# Patient Record
Sex: Female | Born: 1950 | Race: White | Hispanic: No | Marital: Married | Smoking: Never smoker
Health system: Southern US, Community
[De-identification: ages and names within clinical notes are randomized; demographics above are authoritative.]

## PROBLEM LIST (undated history)

## (undated) DIAGNOSIS — D329 Benign neoplasm of meninges, unspecified: Secondary | ICD-10-CM

## (undated) DIAGNOSIS — J385 Laryngeal spasm: Secondary | ICD-10-CM

## (undated) DIAGNOSIS — K219 Gastro-esophageal reflux disease without esophagitis: Secondary | ICD-10-CM

## (undated) DIAGNOSIS — E7800 Pure hypercholesterolemia, unspecified: Secondary | ICD-10-CM

## (undated) DIAGNOSIS — J3800 Paralysis of vocal cords and larynx, unspecified: Secondary | ICD-10-CM

## (undated) HISTORY — PX: PLACEMENT OF BREAST IMPLANTS: SHX6334

## (undated) HISTORY — PX: OTHER SURGICAL HISTORY: SHX169

## (undated) HISTORY — PX: CATARACT EXTRACTION: SUR2

## (undated) HISTORY — PX: TONSILECTOMY, ADENOIDECTOMY, BILATERAL MYRINGOTOMY AND TUBES: SHX2538

---

## 2015-11-09 DIAGNOSIS — N3946 Mixed incontinence: Secondary | ICD-10-CM | POA: Insufficient documentation

## 2015-11-09 DIAGNOSIS — R319 Hematuria, unspecified: Secondary | ICD-10-CM | POA: Insufficient documentation

## 2016-07-24 DIAGNOSIS — J3800 Paralysis of vocal cords and larynx, unspecified: Secondary | ICD-10-CM

## 2016-07-24 HISTORY — PX: BRAIN SURGERY: SHX531

## 2016-07-24 HISTORY — PX: OTHER SURGICAL HISTORY: SHX169

## 2016-07-24 HISTORY — DX: Paralysis of vocal cords and larynx, unspecified: J38.00

## 2017-06-25 DIAGNOSIS — K219 Gastro-esophageal reflux disease without esophagitis: Secondary | ICD-10-CM | POA: Insufficient documentation

## 2017-07-04 DIAGNOSIS — R1319 Other dysphagia: Secondary | ICD-10-CM | POA: Insufficient documentation

## 2017-07-04 DIAGNOSIS — I10 Essential (primary) hypertension: Secondary | ICD-10-CM | POA: Insufficient documentation

## 2017-07-04 DIAGNOSIS — G936 Cerebral edema: Secondary | ICD-10-CM | POA: Insufficient documentation

## 2017-07-05 DIAGNOSIS — R0681 Apnea, not elsewhere classified: Secondary | ICD-10-CM | POA: Insufficient documentation

## 2017-07-19 DIAGNOSIS — D329 Benign neoplasm of meninges, unspecified: Secondary | ICD-10-CM | POA: Insufficient documentation

## 2017-07-19 DIAGNOSIS — Z86018 Personal history of other benign neoplasm: Secondary | ICD-10-CM | POA: Insufficient documentation

## 2017-07-19 DIAGNOSIS — R9 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: Secondary | ICD-10-CM | POA: Insufficient documentation

## 2019-06-16 ENCOUNTER — Other Ambulatory Visit: Payer: Self-pay

## 2019-06-16 DIAGNOSIS — Z20822 Contact with and (suspected) exposure to covid-19: Secondary | ICD-10-CM

## 2019-06-17 LAB — NOVEL CORONAVIRUS, NAA: SARS-CoV-2, NAA: NOT DETECTED

## 2019-08-24 ENCOUNTER — Ambulatory Visit: Payer: Self-pay

## 2019-08-29 ENCOUNTER — Ambulatory Visit: Payer: Self-pay

## 2019-08-31 ENCOUNTER — Ambulatory Visit: Payer: Self-pay

## 2019-12-12 DIAGNOSIS — E782 Mixed hyperlipidemia: Secondary | ICD-10-CM | POA: Insufficient documentation

## 2020-02-03 ENCOUNTER — Emergency Department: Payer: Medicare PPO

## 2020-02-03 ENCOUNTER — Other Ambulatory Visit: Payer: Self-pay

## 2020-02-03 ENCOUNTER — Emergency Department
Admission: EM | Admit: 2020-02-03 | Discharge: 2020-02-03 | Disposition: A | Discharge status: Home / Self Care | Payer: Medicare PPO | Attending: Emergency Medicine | Admitting: Emergency Medicine

## 2020-02-03 DIAGNOSIS — J385 Laryngeal spasm: Secondary | ICD-10-CM

## 2020-02-03 DIAGNOSIS — R0989 Other specified symptoms and signs involving the circulatory and respiratory systems: Secondary | ICD-10-CM | POA: Insufficient documentation

## 2020-02-03 DIAGNOSIS — R911 Solitary pulmonary nodule: Secondary | ICD-10-CM

## 2020-02-03 HISTORY — DX: Benign neoplasm of meninges, unspecified: D32.9

## 2020-02-03 HISTORY — DX: Paralysis of vocal cords and larynx, unspecified: J38.00

## 2020-02-03 HISTORY — DX: Pure hypercholesterolemia, unspecified: E78.00

## 2020-02-03 LAB — CBC
HCT: 37.8 % (ref 36.0–46.0)
Hemoglobin: 12.6 g/dL (ref 12.0–15.0)
MCH: 30 pg (ref 26.0–34.0)
MCHC: 33.3 g/dL (ref 30.0–36.0)
MCV: 90 fL (ref 80.0–100.0)
Platelets: 191 10*3/uL (ref 150–400)
RBC: 4.2 MIL/uL (ref 3.87–5.11)
RDW: 13.6 % (ref 11.5–15.5)
WBC: 6.6 10*3/uL (ref 4.0–10.5)
nRBC: 0 % (ref 0.0–0.2)

## 2020-02-03 LAB — BASIC METABOLIC PANEL
Anion gap: 10 (ref 5–15)
BUN: 14 mg/dL (ref 8–23)
CO2: 28 mmol/L (ref 22–32)
Calcium: 8.9 mg/dL (ref 8.9–10.3)
Chloride: 97 mmol/L — ABNORMAL LOW (ref 98–111)
Creatinine, Ser: 0.59 mg/dL (ref 0.44–1.00)
GFR calc Af Amer: >60 mL/min (ref >60)
GFR calc non Af Amer: >60 mL/min (ref >60)
Glucose, Bld: 94 mg/dL (ref 70–99)
Potassium: 4.1 mmol/L (ref 3.5–5.1)
Sodium: 135 mmol/L (ref 135–145)

## 2020-02-03 LAB — TROPONIN I (HIGH SENSITIVITY): Troponin I (High Sensitivity): 3 ng/L (ref <18)

## 2020-02-03 MED ORDER — IOHEXOL 350 MG/ML SOLN
75.0000 mL | Freq: Once | INTRAVENOUS | Status: AC | PRN
  Administered 2020-02-03: 75 mL via INTRAVENOUS

## 2020-02-03 NOTE — ED Triage Notes (Signed)
Reports awakening from cough with SOB towards right side of her lungs. States that it was severe upon awakening but is mild SOB now, "I don't feel like I can get a good breath". Pt talks in complete sentences without any difficulty. No RR distress noted.

## 2020-02-03 NOTE — ED Notes (Signed)
Ppt to CT

## 2020-02-03 NOTE — ED Provider Notes (Signed)
ER Provider Note       Time seen: 8:48 AM    I have reviewed the vital signs and the nursing notes.  HISTORY   Chief Complaint Shortness of Breath   HPI Phyllis Andrews is a 69 y.o. female with a history of hyperlipidemia, meningioma, vocal cord paralysis who presents today for awakening with feeling like something was choking her.  She felt like she could not breathe and like her airway was closed when she abruptly woke up.  She states she does not have any current symptoms but felt like she could not take a deep breath earlier.  She has no pain now.  Past Medical History:  Diagnosis Date  . Hypercholesteremia   . Meningioma ([place])   . Paralyzed vocal cords     History reviewed. No pertinent surgical history.  Allergies Methylprednisolone acetate and Other  Review of Systems Constitutional: Negative for fever. Cardiovascular: Negative for chest pain. HEENT: Positive for choking sensation Respiratory: Positive for difficulty breathing Gastrointestinal: Negative for abdominal pain, vomiting and diarrhea. Musculoskeletal: Negative for back pain. Skin: Negative for rash. Neurological: Negative for headaches, focal weakness or numbness.  All systems negative/normal/unremarkable except as stated in the HPI  ____________________________________________   PHYSICAL EXAM:  VITAL SIGNS: Vitals:   02/03/20 0139 02/03/20 0724  BP: (!) 120/106 (!) 134/59  Pulse: 79 (!) 58  Resp: 18 14  Temp: 98 F (36.7 C)   SpO2: 98% 100%    Constitutional: Alert and oriented. Well appearing and in no distress. Eyes: Conjunctivae are normal. Normal extraocular movements. ENT      Head: Normocephalic and atraumatic.      Nose: No congestion/rhinnorhea.      Mouth/Throat: Mucous membranes are moist.      Neck: Hoarse voice which she states is chronic for her Cardiovascular: Normal rate, regular rhythm. No murmurs, rubs, or gallops. Respiratory: Normal respiratory effort without  tachypnea nor retractions. Breath sounds are clear and equal bilaterally. No wheezes/rales/rhonchi. Gastrointestinal: Soft and nontender. Normal bowel sounds Musculoskeletal: Nontender with normal range of motion in extremities. No lower extremity tenderness nor edema. Neurologic:  Normal speech and language. No gross focal neurologic deficits are appreciated.  Skin:  Skin is warm, dry and intact. No rash noted. Psychiatric: Speech and behavior are normal.  ____________________________________________  EKG: Interpreted by me.  Sinus rhythm with rate of 73 bpm, normal PR interval, normal QRS, normal QT  ____________________________________________   LABS (pertinent positives/negatives)  Labs Reviewed  BASIC METABOLIC PANEL - Abnormal; Notable for the following components:      Result Value   Chloride 97 (*)    All other components within normal limits  CBC  TROPONIN I (HIGH SENSITIVITY)    RADIOLOGY  Images were viewed by me IMPRESSION: 1. No acute cardiopulmonary process. 2. Right perihilar nodule. Further evaluation with a chest CT Recommended. CT neck with contrast, CT angiogram of the chest IMPRESSION: Patent airway. No neck mass or adenopathy. 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 IMPRESSION: 1. 2.4 cm right lower lobe nodule, recommend multi-disciplinary thoracic oncology referral. 2. No adenopathy. 3. Negative for pulmonary embolism or other acute finding.  DIFFERENTIAL DIAGNOSIS  Laryngeal spasm, choking episode, PE, cancer, vocal cord paralysis, swallowing dysfunction  ASSESSMENT AND PLAN  Choking sensation, pulmonary nodule   Plan: The patient had presented for choking sensation. Patient's labs are unremarkable.  Chest x-ray did reveal a right perihilar nodule for which we obtained CT imaging.  CT imaging did reveal a 2.4 cm right lower lobe nodule, CT of the neck was unremarkable.  Patient was shown this  nodule and encouraged to have outpatient biopsy or at least thoracic/oncology follow-up.  Patient is agreeable to plan, likely has laryngeal spasm today but is cleared for outpatient follow-up.  Lenise Arena MD    Note: This note was generated in part or whole with voice recognition software. Voice recognition is usually quite accurate but there are transcription errors that can and very often do occur. I apologize for any typographical errors that were not detected and corrected.     Earleen Newport, MD 02/03/20 1014

## 2020-02-06 ENCOUNTER — Ambulatory Visit: Payer: Medicare PPO | Admitting: Cardiothoracic Surgery

## 2020-02-06 ENCOUNTER — Encounter: Payer: Self-pay | Admitting: Cardiothoracic Surgery

## 2020-02-06 ENCOUNTER — Other Ambulatory Visit: Payer: Self-pay

## 2020-02-06 ENCOUNTER — Telehealth: Payer: Self-pay

## 2020-02-06 VITALS — BP 129/74 | HR 92 | Temp 98.3°F | Resp 12 | Ht 67.0 in | Wt 170.0 lb

## 2020-02-06 DIAGNOSIS — R918 Other nonspecific abnormal finding of lung field: Secondary | ICD-10-CM

## 2020-02-06 NOTE — Telephone Encounter (Signed)
Completed release of record [facility]ed to [facility] to obtain all images from  2018. Confirmation received.

## 2020-02-06 NOTE — Patient Instructions (Addendum)
I will call [facility] to get a copy of your past chest x-rays.  Follow up here in 2 weeks.

## 2020-02-06 NOTE — Progress Notes (Signed)
Patient ID: Phyllis Andrews, female   DOB: Dec 25, 1950, 69 y.o.   MRN: [id number]  Chief Complaint  Patient presents with  . New Patient (Initial Visit)    pulmonary nodule    Referred By Dr. Tressia Miners Reason for Referral right lower lobe mass  HPI Location, Quality, Duration, Severity, Timing, Context, Modifying Factors, Associated Signs and Symptoms.  Phyllis Andrews is a 69 y.o. female.  This patient has a complicated history which dates back to about 2 years ago.  She underwent resection of a lesion on left through a right occipital incision.  At the time she tells me she had brainstem compression and ultimately ended up with a right vocal cord paralysis requiring medialization.  She has had 2 episodes of acute shortness of breath which she has attributes to issues related to her vocal cords since that time.  She presented to our emergency department earlier this week with complaints of acute shortness of breath.  She states this only lasted about a minute but since this has been a second episode she thought she would get it checked.  When she arrived here she was back to her baseline.  She had no complaints.  However she did wait to be seen by our ER physicians and a chest x-ray CT scan of the neck and chest were performed.  The chest x-ray revealed a 2.5 cm lobulated mass in the right hilum.  The CT scan appeared to show a benign (hamartoma) in the right lower lobe.  There were no other acute findings to explain her shortness of breath.  She presents now for further evaluation of this.  Of note is that she brought with her a chest x-ray report from [place] in 2018 when she underwent a craniotomy.  That reveals a lobulated right hilar mass.  No additional follow-up was performed at that time.  She does not recall receiving a CT scan back then of her chest.  She is a lifelong non-smoker.  She does not get short of breath.  She has had no hemoptysis fevers or chills.   Past Medical History:  Diagnosis Date   . Hypercholesteremia   . Meningioma ([place])   . Paralyzed vocal cords     Past Surgical History:  Procedure Laterality Date  . menigioma  2018  . PLACEMENT OF BREAST IMPLANTS    . TONSILECTOMY, ADENOIDECTOMY, BILATERAL MYRINGOTOMY AND TUBES      Family History  Problem Relation Age of Onset  . Dementia Mother   . Dementia Father   . Prostate cancer Brother     Social History Social History   Tobacco Use  . Smoking status: Never Smoker  . Smokeless tobacco: Never Used  Substance Use Topics  . Alcohol use: Not Currently  . Drug use: Never    Allergies  Allergen Reactions  . Methylprednisolone Acetate Other (See Comments)    Severe facial swelling  . Other     A shot for poison ivy made her face swell.    Current Outpatient Medications  Medication Sig Dispense Refill  . acetaminophen (TYLENOL) 325 MG tablet Take by mouth.    . calcium-vitamin D (OSCAL WITH D) 500-200 MG-UNIT TABS tablet Take by mouth.    . Coenzyme Q10 10 MG capsule Take by mouth.    . famotidine (PEPCID) 10 MG tablet Take by mouth.    . fluticasone (FLONASE) 50 MCG/ACT nasal spray Place into the nose.    . melatonin 1 MG TABS tablet Take by mouth.    Marland Kitchen  niMODipine (NYMALIZE) 60 MG/20ML SOLN Give 10 mL (30 mg total) into tube every 4 (four) hours.    . Omega-3 Fatty Acids (RA FISH OIL) 1000 MG CAPS Take by mouth.    . pravastatin (PRAVACHOL) 20 MG tablet Take 20 mg by mouth daily.    . psyllium (METAMUCIL) 58.6 % packet Take by mouth.     No current facility-administered medications for this visit.      Review of Systems A complete review of systems was asked and was negative except for the following positive findings heartburn, constipation, fever and chills.  Blood pressure 129/74, pulse 92, temperature 98.3 F (36.8 C), temperature source Oral, resp. rate 12, height 5\' 7"  (1.702 m), weight 170 lb (77.1 kg), SpO2 97 %.  Physical Exam CONSTITUTIONAL:  Pleasant, well-developed,  well-nourished, and in no acute distress. EYES: Pupils equal and reactive to light, Sclera non-icteric EARS, NOSE, MOUTH AND THROAT:  The oropharynx was clear.  Dentition is good repair.  Oral mucosa pink and moist. LYMPH NODES:  Lymph nodes in the neck and axillae were normal RESPIRATORY:  Lungs were clear.  Normal respiratory effort without pathologic use of accessory muscles of respiration CARDIOVASCULAR: Heart was regular without murmurs.  There were no carotid bruits. GI: The abdomen was soft, nontender, and nondistended. There were no palpable masses. There was no hepatosplenomegaly. There were normal bowel sounds in all quadrants. GU:  Rectal deferred.   MUSCULOSKELETAL:  Normal muscle strength and tone.  No clubbing or cyanosis.   SKIN:  There were no pathologic skin lesions.  There were no nodules on palpation. NEUROLOGIC:  Sensation is normal.  Cranial nerves are grossly intact. PSYCH:  Oriented to person, place and time.  Mood and affect are normal.  Data Reviewed CT scan and chest x-ray  I have personally reviewed the patient's imaging, laboratory findings and medical records.    Assessment    I have reviewed the patient's CT scan and chest x-ray.  There is a lobulated right lower lobe mass.  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  She did bring with her records from [place] saying that there is a right hilar mass.  We will obtain those x-rays from 2018 and compare them with our current x-rays.  We also reviewed the options of PET scanning, bronchoscopic biopsy or surgical resection.  At present time she would like [place] to compare the x-rays first to see if there is been any change.  We will see her back again in 2 weeks.       Nestor Lewandowsky, MD 02/06/2020, 9:32 AM

## 2020-02-10 NOTE — Telephone Encounter (Signed)
Spoke with [facility] radiology- images shared-spoke with Corene Cornea to upload at [facility] -spoke with Diane at [facility] radiology to read and compare 2018 images to July 2021 images.

## 2020-02-20 ENCOUNTER — Ambulatory Visit: Payer: Medicare PPO | Admitting: Cardiothoracic Surgery

## 2020-02-20 ENCOUNTER — Encounter: Payer: Self-pay | Admitting: Cardiothoracic Surgery

## 2020-02-20 ENCOUNTER — Other Ambulatory Visit: Payer: Self-pay

## 2020-02-20 VITALS — BP 128/78 | HR 68 | Temp 98.1°F | Ht 67.0 in | Wt 168.8 lb

## 2020-02-20 DIAGNOSIS — R918 Other nonspecific abnormal finding of lung field: Secondary | ICD-10-CM

## 2020-02-20 NOTE — Patient Instructions (Addendum)
We will get you set up for Pulmonary Function studies.  You are scheduled for this on Tuesday August 17th at 9:00 am. You will need to arrive there by 9:00 am and go in through the [place] entrance at [facility]. NO Caffeine at all prior to this test.   You will need a Covid test the day prior on Monday August 16th at the [place] testing site between 8 am and 12 pm.   You are scheduled for a PET scan at [facility] on Wednesday August 4th at 12:30 pm.  You will need to arrive there by 12:00 pm and go in through the [facility], you will need to have nothing to eat for 6 hours prior. You may have water.   We will have you follow up here in a few weeks after these tests are completed. Please see your appointment.

## 2020-02-20 NOTE — Progress Notes (Signed)
Phyllis Andrews Follow Up Note  Patient ID: Phyllis Andrews, female   DOB: 1951-01-08, 69 y.o.   MRN: [id number]  HISTORY: She returns today in follow-up.  We were able to get the x-rays from [facility] and we were able to compare them to her most recent ones.  The x-rays from [facility] are portable chest x-rays but the official radiology report shows that the lesion in the right lower lobe is somewhat more conspicuous and perhaps larger than it was 3 years ago.  I did measure the 2 images and there has been a slight growth from approximately 2.3 to 2.8 cm over the last 3 years.    Vitals:   02/20/20 0850  BP: 128/78  Pulse: 68  Temp: 98.1 F (36.7 C)  SpO2: 96%     EXAM:  Resp: Lungs are clear bilaterally.  No respiratory distress, normal effort. Heart:  Regular without murmurs Abd:  Abdomen is soft, non distended and non tender. No masses are palpable.  There is no rebound and no guarding.  Neurological: Alert and oriented to person, place, and time. Coordination normal.  Skin: Skin is warm and dry. No rash noted. No diaphoretic. No erythema. No pallor.  Psychiatric: Normal mood and affect. Normal behavior. Judgment and thought content normal.      ASSESSMENT: Right lower lobe lobulated mass   PLAN:   I had a long discussion with the patient regarding the indications and risks of right thoracotomy and right lower lobectomy.  I explained her that I thought that this lesion was most likely a hamartoma although a carcinoid may have a similar appearance.  I also reviewed with her the possibility of a bronchoscopy and biopsy.  We discussed the alternatives to surgery which would include biopsy and/or continued observation.  We discussed the indications and risks of thoracotomy and including the risks of bleeding, infection, air leak and death.  We also discussed the long-term consequences of lower lobe resection.  Her and her husband have thought about their options and they would like to obtain a PET scan and  a complete set of pulmonary function studies prior to making any final decisions.  I am somewhat reassured of the slow growth of the lesion over the last 3 years.  We will obtain a PET scan and pulmonary function studies and I will see her back in 2 weeks    Nestor Lewandowsky, MD

## 2020-02-25 ENCOUNTER — Ambulatory Visit
Admission: RE | Admit: 2020-02-25 | Discharge: 2020-02-25 | Disposition: A | Discharge status: Home / Self Care | Payer: Medicare PPO | Source: Ambulatory Visit | Attending: Cardiothoracic Surgery | Admitting: Cardiothoracic Surgery

## 2020-02-25 ENCOUNTER — Other Ambulatory Visit: Payer: Self-pay

## 2020-02-25 DIAGNOSIS — R918 Other nonspecific abnormal finding of lung field: Secondary | ICD-10-CM | POA: Insufficient documentation

## 2020-02-25 LAB — GLUCOSE, CAPILLARY: Glucose-Capillary: 76 mg/dL (ref 70–99)

## 2020-02-25 MED ORDER — FLUDEOXYGLUCOSE F - 18 (FDG) INJECTION
8.7000 | Freq: Once | INTRAVENOUS | Status: AC | PRN
  Administered 2020-02-25: 8.97 via INTRAVENOUS

## 2020-03-08 ENCOUNTER — Other Ambulatory Visit
Admission: RE | Admit: 2020-03-08 | Discharge: 2020-03-08 | Disposition: A | Discharge status: Home / Self Care | Payer: Medicare PPO | Source: Ambulatory Visit | Attending: Cardiothoracic Surgery | Admitting: Cardiothoracic Surgery

## 2020-03-08 ENCOUNTER — Other Ambulatory Visit: Payer: Self-pay

## 2020-03-08 DIAGNOSIS — Z20822 Contact with and (suspected) exposure to covid-19: Secondary | ICD-10-CM | POA: Insufficient documentation

## 2020-03-08 DIAGNOSIS — Z01812 Encounter for preprocedural laboratory examination: Secondary | ICD-10-CM | POA: Diagnosis present

## 2020-03-08 LAB — SARS CORONAVIRUS 2 (TAT 6-24 HRS): SARS Coronavirus 2: NEGATIVE

## 2020-03-09 ENCOUNTER — Ambulatory Visit: Payer: Medicare PPO | Attending: Cardiothoracic Surgery

## 2020-03-09 DIAGNOSIS — R918 Other nonspecific abnormal finding of lung field: Secondary | ICD-10-CM | POA: Diagnosis not present

## 2020-03-09 MED ORDER — ALBUTEROL SULFATE (2.5 MG/3ML) 0.083% IN NEBU
2.5000 mg | INHALATION_SOLUTION | Freq: Once | RESPIRATORY_TRACT | Status: AC
  Administered 2020-03-09: 2.5 mg via RESPIRATORY_TRACT
  Filled 2020-03-09: qty 3

## 2020-03-10 LAB — PULMONARY FUNCTION TEST ARMC ONLY
DL/VA % pred: 119 %
DL/VA: 4.84 ml/min/mmHg/L
DLCO unc % pred: 92 %
DLCO unc: 20.04 ml/min/mmHg
FEF 25-75 Post: 2.21 L/sec
FEF 25-75 Pre: 1.97 L/sec
FEF2575-%Change-Post: 12 %
FEF2575-%Pred-Post: 102 %
FEF2575-%Pred-Pre: 90 %
FEV1-%Change-Post: 10 %
FEV1-%Pred-Post: 80 %
FEV1-%Pred-Pre: 73 %
FEV1-Post: 2.12 L
FEV1-Pre: 1.93 L
FEV1FVC-%Change-Post: 11 %
FEV1FVC-%Pred-Pre: 96 %
FEV6-%Change-Post: 4 %
FEV6-%Pred-Post: 77 %
FEV6-%Pred-Pre: 73 %
FEV6-Post: 2.54 L
FEV6-Pre: 2.43 L
FEV6FVC-%Pred-Post: 104 %
FEV6FVC-%Pred-Pre: 104 %
FVC-%Change-Post: -1 %
FVC-%Pred-Post: 75 %
FVC-%Pred-Pre: 76 %
FVC-Post: 2.58 L
FVC-Pre: 2.62 L
Post FEV1/FVC ratio: 82 %
Post FEV6/FVC ratio: 100 %
Pre FEV1/FVC ratio: 74 %
Pre FEV6/FVC Ratio: 100 %
RV % pred: 41 %
RV: 0.96 L
TLC % pred: 64 %
TLC: 3.54 L

## 2020-03-12 ENCOUNTER — Other Ambulatory Visit: Payer: Self-pay

## 2020-03-12 ENCOUNTER — Ambulatory Visit: Payer: Medicare PPO | Admitting: Cardiothoracic Surgery

## 2020-03-12 ENCOUNTER — Encounter: Payer: Self-pay | Admitting: Cardiothoracic Surgery

## 2020-03-12 VITALS — BP 134/76 | HR 70 | Temp 98.3°F | Ht 67.0 in | Wt 165.0 lb

## 2020-03-12 DIAGNOSIS — R918 Other nonspecific abnormal finding of lung field: Secondary | ICD-10-CM | POA: Diagnosis not present

## 2020-03-12 NOTE — Progress Notes (Signed)
Phyllis Andrews Follow Up Note  Patient ID: Phyllis Andrews, female   DOB: 1951-02-09, 69 y.o.   MRN: [id number]  HISTORY: This patient returns today in follow-up. She has no new complaints. She is not short of breath. She did have a PET scan and pulmonary function studies performed. I have independently reviewed those are reviewed those with her today.    Vitals:   03/12/20 0801  BP: 134/76  Pulse: 70  Temp: 98.3 F (36.8 C)  SpO2: 96%     EXAM:  Resp: Lungs are clear bilaterally.  No respiratory distress, normal effort. Heart:  Regular without murmurs Skin: Skin is warm and dry. No rash noted. No diaphoretic. No erythema. No pallor.  Psychiatric: Normal mood and affect. Normal behavior. Judgment and thought content normal.    Independent review of her PET scan shows no hypermetabolic activity in the right lower lobe nodule. That this is most consistent with a hamartoma.  ASSESSMENT: Lower lobe 2.5 cm nonhypermetabolic mass   PLAN:   I had a long discussion today with the patient regarding the options. We discussed the role of CT surveillance, bronchoscopic evaluation with biopsy and finally surgical resection. I reviewed with her the indications and risks as well as advantages and disadvantages of these various techniques. She understands that surgical resection is the definitive management for pulmonary hamartomas. However she is reluctant to undergo surgical resection at this time. She would like to continue CT surveillance. She has declined bronchoscopy or surgery. We will set her up to come back and see me in 6 months with a noncontrast chest CT. All of her questions were answered.    Nestor Lewandowsky, MD

## 2020-03-12 NOTE — Patient Instructions (Addendum)
We have spoken to you about your pulmonary nodule. We have let you know you can have it removed, have it biopsied, or just watch the nodule to see if it changes.   We will have you return here in 6 months with a CT of the lungs and office follow up. Please call and ask to speak with a nurse if you develop questions or concerns.   Pulmonary Nodule A pulmonary nodule is tissue that has grown on your lung. A nodule may be cancer, but most nodules are not cancer. Follow these instructions at home:   Take over-the-counter and prescription medicines only as told by your doctor.  Do not use any products that have nicotine or tobacco, such as cigarettes and e-cigarettes. If you need help quitting, ask your doctor.  Keep all follow-up visits as told by your doctor. This is important. Contact a doctor if:  You have trouble breathing when doing activities.  You feel sick.  You feel more tired than normal.  You do not feel like eating.  You lose weight without trying.  You have chills.  You have night sweats. Get help right away if:  You cannot catch your breath.  You start making whistling sounds when breathing (wheezing).  You cannot stop coughing.  You cough up blood.  You get dizzy.  You feel like you are going to pass out (faint).  You have sudden chest pain.  You have a fever or symptoms for more than 2-3 days.  You have a fever and your symptoms suddenly get worse. Summary  A pulmonary nodule is tissue that has grown on your lung.  Most nodules are not cancer.  Your doctor will do tests to know what kind of nodule you have, and whether you need treatment for it. This information is not intended to replace advice given to you by your health care provider. Make sure you discuss any questions you have with your health care provider. Document Revised: 08/03/2017 Document Reviewed: 08/08/2016 Elsevier Patient Education  [place].

## 2020-09-20 ENCOUNTER — Telehealth: Payer: Self-pay

## 2020-09-20 DIAGNOSIS — R911 Solitary pulmonary nodule: Secondary | ICD-10-CM

## 2020-09-20 NOTE — Telephone Encounter (Signed)
Left message for patient to return call.   Patient last seen Dr.Oaks 03/12/20-instructed to follow up in 6 months with CT Chest without contrast- order placed- Dr.Pabon will review results and patient will be notified of results. CT Chest without -order placed just needs to be scheduled once patient returns call.

## 2020-09-21 NOTE — Telephone Encounter (Signed)
Left message for patient to return call to office.   CT Chest without contrast scheduled 10/06/20@ 8:15 Outpatient [facility] [place] will review results and we will call patient to let her know.

## 2020-10-06 ENCOUNTER — Ambulatory Visit
Admission: RE | Admit: 2020-10-06 | Discharge: 2020-10-06 | Disposition: A | Discharge status: Home / Self Care | Payer: Medicare PPO | Source: Ambulatory Visit | Attending: Surgery | Admitting: Surgery

## 2020-10-06 ENCOUNTER — Other Ambulatory Visit: Payer: Self-pay

## 2020-10-06 DIAGNOSIS — R911 Solitary pulmonary nodule: Secondary | ICD-10-CM | POA: Diagnosis not present

## 2021-04-01 IMAGING — CT NM PET TUM IMG INITIAL (PI) SKULL BASE T - THIGH
1 of 10 series · 1 of 25 positions shown · non-contrast
Comparison: Chest CT 02/03/2020

CLINICAL DATA: Initial treatment strategy for solitary pulmonary
nodule.

EXAM:
NUCLEAR MEDICINE PET SKULL BASE TO THIGH
TECHNIQUE: 8.9 mCi F-18 FDG was injected intravenously. Full-ring PET imaging
was performed from the skull base to thigh after the radiotracer. CT
data was obtained and used for attenuation correction and anatomic
localization.
Fasting blood glucose: 78 mg/dl

[Series 3: ct wb 5.0 b30f · axial · 5.0mm · 0.98mm/px · 1 of 289 slices shown]
[im 289/289  brain]
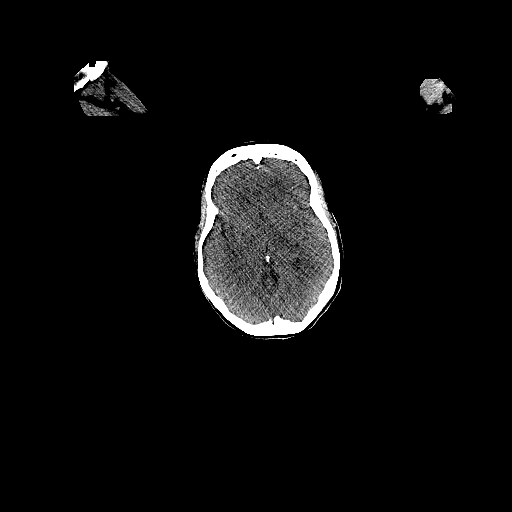

[1 of 25 positions shown; findings below may reference images not displayed]

FINDINGS: Mediastinal blood pool activity: SUV max

Liver activity: SUV max NA

NECK: No hypermetabolic lymph nodes in the neck.

Incidental CT findings: none

CHEST: Lobular nodule within RIGHT lower lobe has relatively low
metabolic activity for size with SUV max equal 2.8. Lesion measures
2.3 cm (image 93/series 3) and has low attenuation (HU equal 2.0).
Small peripheral calcification noted.

No additional pulmonary nodules. No hypermetabolic mediastinal lymph
nodes.

Incidental CT findings: Subglandular breast implants noted.

ABDOMEN/PELVIS: No abnormal hypermetabolic activity within the
liver, pancreas, adrenal glands, or spleen. No hypermetabolic lymph
nodes in the abdomen or pelvis.

Incidental CT findings: Atherosclerotic calcification of the aorta.

SKELETON: No focal hypermetabolic activity to suggest skeletal
metastasis.

Incidental CT findings: none
IMPRESSION: 1. Lobular nodule in the RIGHT lower lobe has several indolent
characteristics including low-density, peripheral calcification, and
relatively low metabolic activity. Top differential include benign
hamartoma versus bronchial carcinoid tumor. No remote imaging
available for size stability. Consider CT surveillance versus tissue
sampling or resection.
2. No evidence of metastatic disease on whole-body FDG PET scan.

## 2021-06-03 ENCOUNTER — Encounter: Payer: Self-pay | Admitting: *Deleted

## 2021-06-06 ENCOUNTER — Ambulatory Visit: Payer: Medicare PPO | Admitting: Anesthesiology

## 2021-06-06 ENCOUNTER — Other Ambulatory Visit: Payer: Self-pay

## 2021-06-06 ENCOUNTER — Ambulatory Visit
Admission: RE | Admit: 2021-06-06 | Discharge: 2021-06-06 | Disposition: A | Discharge status: Home / Self Care | Payer: Medicare PPO | Attending: Gastroenterology | Admitting: Gastroenterology

## 2021-06-06 ENCOUNTER — Encounter: Payer: Self-pay | Admitting: *Deleted

## 2021-06-06 ENCOUNTER — Encounter
Admission: RE | Disposition: A | Discharge status: Home / Self Care | Payer: Self-pay | Source: Home / Self Care | Attending: Gastroenterology

## 2021-06-06 DIAGNOSIS — Z79899 Other long term (current) drug therapy: Secondary | ICD-10-CM | POA: Diagnosis not present

## 2021-06-06 DIAGNOSIS — J45909 Unspecified asthma, uncomplicated: Secondary | ICD-10-CM | POA: Insufficient documentation

## 2021-06-06 DIAGNOSIS — G709 Myoneural disorder, unspecified: Secondary | ICD-10-CM | POA: Insufficient documentation

## 2021-06-06 DIAGNOSIS — Z1211 Encounter for screening for malignant neoplasm of colon: Secondary | ICD-10-CM | POA: Diagnosis not present

## 2021-06-06 DIAGNOSIS — Z86010 Personal history of colonic polyps: Secondary | ICD-10-CM | POA: Diagnosis not present

## 2021-06-06 DIAGNOSIS — K219 Gastro-esophageal reflux disease without esophagitis: Secondary | ICD-10-CM | POA: Diagnosis not present

## 2021-06-06 HISTORY — PX: COLONOSCOPY WITH PROPOFOL: SHX5780

## 2021-06-06 HISTORY — DX: Laryngeal spasm: J38.5

## 2021-06-06 HISTORY — DX: Gastro-esophageal reflux disease without esophagitis: K21.9

## 2021-06-06 SURGERY — COLONOSCOPY WITH PROPOFOL
Anesthesia: General

## 2021-06-06 MED ORDER — MIDAZOLAM HCL 2 MG/2ML IJ SOLN
INTRAMUSCULAR | Status: AC
  Filled 2021-06-06: qty 2

## 2021-06-06 MED ORDER — MIDAZOLAM HCL 2 MG/2ML IJ SOLN
INTRAMUSCULAR | Status: DC | PRN
  Administered 2021-06-06: 2 mg via INTRAVENOUS

## 2021-06-06 MED ORDER — PROPOFOL 500 MG/50ML IV EMUL
INTRAVENOUS | Status: AC
  Filled 2021-06-06: qty 50

## 2021-06-06 MED ORDER — SODIUM CHLORIDE 0.9 % IV SOLN: INTRAVENOUS | Status: DC

## 2021-06-06 MED ORDER — PROPOFOL 500 MG/50ML IV EMUL
INTRAVENOUS | Status: DC | PRN
  Administered 2021-06-06: 50 ug/kg/min via INTRAVENOUS

## 2021-06-06 MED ORDER — LIDOCAINE HCL (CARDIAC) PF 100 MG/5ML IV SOSY
PREFILLED_SYRINGE | INTRAVENOUS | Status: DC | PRN
  Administered 2021-06-06: 50 mg via INTRAVENOUS

## 2021-06-06 MED ORDER — PROPOFOL 10 MG/ML IV BOLUS
INTRAVENOUS | Status: DC | PRN
  Administered 2021-06-06: 10 mg via INTRAVENOUS
  Administered 2021-06-06 (×2): 20 mg via INTRAVENOUS

## 2021-06-06 MED ORDER — FENTANYL CITRATE (PF) 100 MCG/2ML IJ SOLN
INTRAMUSCULAR | Status: DC | PRN
  Administered 2021-06-06 (×4): 25 ug via INTRAVENOUS

## 2021-06-06 MED ORDER — FENTANYL CITRATE (PF) 100 MCG/2ML IJ SOLN
INTRAMUSCULAR | Status: AC
  Filled 2021-06-06: qty 2

## 2021-06-06 NOTE — Interval H&P Note (Signed)
History and Physical Interval Note:  06/06/2021 10:44 AM  Phyllis Andrews  has presented today for surgery, with the diagnosis of hx of adenomatous polyp z86.010.  The various methods of treatment have been discussed with the patient and family. After consideration of risks, benefits and other options for treatment, the patient has consented to  Procedure(s): COLONOSCOPY WITH PROPOFOL (N/A) as a surgical intervention.  The patient's history has been reviewed, patient examined, no change in status, stable for surgery.  I have reviewed the patient's chart and labs.  Questions were answered to the patient's satisfaction.     Lesly Rubenstein  Ok to proceed with colonoscopy

## 2021-06-06 NOTE — H&P (Signed)
Outpatient short stay form Pre-procedure 06/06/2021  Lesly Rubenstein, MD  Primary Physician: Gladstone Lighter, MD  Reason for visit:  Surveillance colonoscopy  History of present illness:   70 y/o lady with history of hyperlipidemia here for surveillance colonoscopy due to history of adenomatous polyp on colonoscopy done 8 years ago. No blood thinners. No significant abdominal surgeries. No family history of GI malignancies.    Current Facility-Administered Medications:    0.9 %  sodium chloride infusion, , Intravenous, Continuous, Jatoria Kneeland, Hilton Cork, MD, Last Rate: 20 mL/hr at 06/06/21 1015, New Bag at 06/06/21 1015  Medications Prior to Admission  Medication Sig Dispense Refill Last Dose   calcium carbonate (OSCAL) 1500 (600 Ca) MG TABS tablet Take by mouth 2 (two) times daily with a meal.   05/30/2021   LACTOBACILLUS PROBIOTIC PO Take by mouth.   06/04/2021   pravastatin (PRAVACHOL) 20 MG tablet Take 20 mg by mouth daily.   06/05/2021   vitamin C (ASCORBIC ACID) 500 MG tablet Take 500 mg by mouth daily.   05/30/2021   acetaminophen (TYLENOL) 325 MG tablet Take 325 mg by mouth every 6 (six) hours as needed.       Cholecalciferol 25 MCG (1000 UT) tablet Take by mouth.   05/30/2021   Coenzyme Q10 10 MG capsule Take by mouth.   05/30/2021   famotidine (PEPCID) 10 MG tablet Take by mouth.   06/02/2021   fluticasone (FLONASE) 50 MCG/ACT nasal spray Place into the nose.   06/03/2021   melatonin 1 MG TABS tablet Take by mouth.      Omega-3 Fatty Acids (RA FISH OIL) 1000 MG CAPS Take by mouth.   05/30/2021   psyllium (METAMUCIL) 58.6 % packet Take by mouth.   05/31/2021     Allergies  Allergen Reactions   Methylprednisolone Acetate Other (See Comments)    Severe facial swelling     Past Medical History:  Diagnosis Date   GERD (gastroesophageal reflux disease)    Hypercholesteremia    Laryngeal spasm    Meningioma ([facility])    Paralyzed vocal cords     Review of systems:   Otherwise negative.    Physical Exam  Gen: Alert, oriented. Appears stated age.  HEENT: PERRLA. Lungs: No respiratory distress CV: RRR Abd: soft, benign, no masses Ext: No edema.    Planned procedures: Proceed with colonoscopy. The patient understands the nature of the planned procedure, indications, risks, alternatives and potential complications including but not limited to bleeding, infection, perforation, damage to internal organs and possible oversedation/side effects from anesthesia. The patient agrees and gives consent to proceed.  Please refer to procedure notes for findings, recommendations and patient disposition/instructions.     Lesly Rubenstein, MD [facility]

## 2021-06-06 NOTE — Transfer of Care (Signed)
Immediate Anesthesia Transfer of Care Note  Patient: Phyllis Andrews  Procedure(s) Performed: COLONOSCOPY WITH PROPOFOL  Patient Location: PACU  Anesthesia Type:General  Level of Consciousness: sedated  Airway & Oxygen Therapy: Patient Spontanous Breathing and Patient connected to nasal cannula oxygen  Post-op Assessment: Report given to RN and Post -op Vital signs reviewed and stable  Post vital signs: Reviewed and stable  Last Vitals:  Vitals Value Taken Time  BP 114/58 06/06/21 1110  Temp    Pulse 71 06/06/21 1111  Resp 17 06/06/21 1111  SpO2 100 % 06/06/21 1111  Vitals shown include unvalidated device data.  Last Pain:  Vitals:   06/06/21 1110  TempSrc:   PainSc: 0-No pain         Complications: No notable events documented.

## 2021-06-06 NOTE — Anesthesia Postprocedure Evaluation (Signed)
Anesthesia Post Note  Patient: Phyllis Andrews  Procedure(s) Performed: COLONOSCOPY WITH PROPOFOL  Patient location during evaluation: Endoscopy Anesthesia Type: General Level of consciousness: awake and alert Pain management: pain level controlled Vital Signs Assessment: post-procedure vital signs reviewed and stable Respiratory status: spontaneous breathing, nonlabored ventilation, respiratory function stable and patient connected to nasal cannula oxygen Cardiovascular status: blood pressure returned to baseline and stable Postop Assessment: no apparent nausea or vomiting Anesthetic complications: no   No notable events documented.   Last Vitals:  Vitals:   06/06/21 1120 06/06/21 1130  BP: 131/74 (!) 178/77  Pulse: 65 (!) 57  Resp: (!) 25 19  Temp:    SpO2: 100% 100%    Last Pain:  Vitals:   06/06/21 1130  TempSrc:   PainSc: 0-No pain                 Precious Haws Lyden Redner

## 2021-06-06 NOTE — Anesthesia Preprocedure Evaluation (Signed)
Anesthesia Evaluation  Patient identified by MRN, date of birth, ID band Patient awake    Reviewed: Allergy & Precautions, NPO status , Patient's Chart, lab work & pertinent test results  History of Anesthesia Complications Negative for: history of anesthetic complications  Airway Mallampati: III  TM Distance: >3 FB Neck ROM: full    Dental  (+) Chipped   Pulmonary asthma ,    Pulmonary exam normal        Cardiovascular Exercise Tolerance: Good (-) angina(-) Past MI negative cardio ROS Normal cardiovascular exam     Neuro/Psych  Neuromuscular disease negative psych ROS   GI/Hepatic Neg liver ROS, GERD  Medicated and Controlled,  Endo/Other  negative endocrine ROS  Renal/GU negative Renal ROS  negative genitourinary   Musculoskeletal   Abdominal   Peds  Hematology negative hematology ROS (+)   Anesthesia Other Findings Past Medical History: No date: GERD (gastroesophageal reflux disease) No date: Hypercholesteremia No date: Laryngeal spasm No date: 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  Past Surgical History: No date: CATARACT EXTRACTION 2018: menigioma No date: PLACEMENT OF BREAST IMPLANTS No date: resection of intracranial mass No date: TONSILECTOMY, ADENOIDECTOMY, BILATERAL MYRINGOTOMY AND TUBES     Reproductive/Obstetrics negative OB ROS                             Anesthesia Physical Anesthesia Plan  ASA: 3  Anesthesia Plan: General   Post-op Pain Management:    Induction: Intravenous  PONV Risk Score and Plan: Propofol infusion and TIVA  Airway Management Planned: Natural Airway and Nasal Cannula  Additional Equipment:   Intra-op Plan:   Post-operative Plan:   Informed Consent: I have reviewed the patients History and Physical, chart, labs and discussed the procedure including the risks, benefits and alternatives for the proposed anesthesia with  the patient or authorized representative who has indicated his/her understanding and acceptance.     Dental Advisory Given  Plan Discussed with: Anesthesiologist, CRNA and Surgeon  Anesthesia Plan Comments: (Patient consented for risks of anesthesia including but not limited to:  - adverse reactions to medications - risk of airway placement if required - damage to eyes, teeth, lips or other oral mucosa - nerve damage due to positioning  - sore throat or hoarseness - Damage to heart, brain, nerves, lungs, other parts of body or loss of life  Patient voiced understanding.)        Anesthesia Quick Evaluation

## 2021-06-06 NOTE — Op Note (Signed)
[facility] Gastroenterology Patient Name: Phyllis Andrews Procedure Date: 06/06/2021 10:16 AM MRN: [id number] Account #: [id number] Date of Birth: 1951/02/03 Admit Type: Outpatient Age: 70 Room: [facility] ENDO ROOM 3 Gender: Female Note Status: Finalized Instrument Name: Park Meo [id number] Procedure:             Colonoscopy Indications:           Surveillance: Personal history of adenomatous polyps                         on last colonoscopy > 5 years ago Providers:             Andrey Farmer MD, MD Referring MD:          Gladstone Lighter, MD (Referring MD) Medicines:             Monitored Anesthesia Care Complications:         No immediate complications. Procedure:             Pre-Anesthesia Assessment:                        - Prior to the procedure, a History and Physical was                         performed, and patient medications and allergies were                         reviewed. The patient is competent. The risks and                         benefits of the procedure and the sedation options and                         risks were discussed with the patient. All questions                         were answered and informed consent was obtained.                         Patient identification and proposed procedure were                         verified by the physician, the nurse, the anesthetist                         and the technician in the endoscopy suite. Mental                         Status Examination: alert and oriented. Airway                         Examination: normal oropharyngeal airway and neck                         mobility. Respiratory Examination: clear to                         auscultation. CV Examination: normal. Prophylactic  Antibiotics: The patient does not require prophylactic                         antibiotics. Prior Anticoagulants: The patient has                         taken no previous anticoagulant or  antiplatelet                         agents. ASA Grade Assessment: I - A normal, healthy                         patient. After reviewing the risks and benefits, the                         patient was deemed in satisfactory condition to                         undergo the procedure. The anesthesia plan was to use                         monitored anesthesia care (MAC). Immediately prior to                         administration of medications, the patient was                         re-assessed for adequacy to receive sedatives. The                         heart rate, respiratory rate, oxygen saturations,                         blood pressure, adequacy of pulmonary ventilation, and                         response to care were monitored throughout the                         procedure. The physical status of the patient was                         re-assessed after the procedure.                        After obtaining informed consent, the colonoscope was                         passed under direct vision. Throughout the procedure,                         the patient's blood pressure, pulse, and oxygen                         saturations were monitored continuously. The                         Colonoscope was introduced through the anus and  advanced to the the cecum, identified by appendiceal                         orifice and ileocecal valve. The colonoscopy was                         performed without difficulty. The patient tolerated                         the procedure well. The quality of the bowel                         preparation was good. Findings:      The perianal and digital rectal examinations were normal.      The entire examined colon appeared normal on direct and retroflexion       views. Impression:            - The entire examined colon is normal on direct and                         retroflexion views.                        - No specimens  collected. Recommendation:        - Discharge patient to home.                        - Resume previous diet.                        - Continue present medications.                        - Repeat colonoscopy is not recommended due to current                         age (73 years or older) for surveillance.                        - Return to referring physician as previously                         scheduled. Procedure Code(s):     --- Professional ---                        X7353, Colorectal cancer screening; colonoscopy on                         individual at high risk Diagnosis Code(s):     --- Professional ---                        Z86.010, Personal history of colonic polyps CPT copyright 2019 American Medical Association. All rights reserved. The codes documented in this report are preliminary and upon coder review may  be revised to meet current compliance requirements. Andrey Farmer MD, MD 06/06/2021 11:09:41 AM Number of Addenda: 0 Note Initiated On: 06/06/2021 10:16 AM Scope Withdrawal Time: 0 hours 7 minutes 26 seconds  Total Procedure Duration: 0 hours 14 minutes 51 seconds  Estimated Blood Loss:  Estimated blood loss: none.      [facility]

## 2021-06-07 ENCOUNTER — Encounter: Payer: Self-pay | Admitting: Gastroenterology

## 2021-09-19 ENCOUNTER — Other Ambulatory Visit: Payer: Self-pay

## 2021-09-19 DIAGNOSIS — R918 Other nonspecific abnormal finding of lung field: Secondary | ICD-10-CM

## 2021-09-21 ENCOUNTER — Telehealth: Payer: Self-pay

## 2021-09-21 NOTE — Telephone Encounter (Signed)
Imaging reviewed with patient and follow up scheduled.  ?

## 2021-09-21 NOTE — Telephone Encounter (Signed)
Patient is scheduled for a CT scan of the chest w/o contrast at [facility] on 10/07/21 at 1:30 pm. She is to arrive there by 1:15 pm, there is no prep needed for this exam.  ?Message left for the patient to call back to go over this information and also to schedule a follow up appointment with Dr Dahlia Byes for one week after the scan.  ?

## 2021-10-07 ENCOUNTER — Other Ambulatory Visit: Payer: Self-pay

## 2021-10-07 ENCOUNTER — Ambulatory Visit
Admission: RE | Admit: 2021-10-07 | Discharge: 2021-10-07 | Disposition: A | Discharge status: Home / Self Care | Payer: Medicare PPO | Source: Ambulatory Visit | Attending: Surgery | Admitting: Surgery

## 2021-10-07 DIAGNOSIS — R918 Other nonspecific abnormal finding of lung field: Secondary | ICD-10-CM | POA: Insufficient documentation

## 2021-10-17 ENCOUNTER — Ambulatory Visit: Payer: Medicare PPO | Admitting: Surgery

## 2021-10-17 ENCOUNTER — Other Ambulatory Visit: Payer: Self-pay

## 2021-10-17 ENCOUNTER — Encounter: Payer: Self-pay | Admitting: Surgery

## 2021-10-17 VITALS — BP 126/74 | HR 86 | Temp 98.1°F | Ht 67.0 in | Wt 170.2 lb

## 2021-10-17 DIAGNOSIS — Z9882 Breast implant status: Secondary | ICD-10-CM | POA: Diagnosis not present

## 2021-10-17 DIAGNOSIS — R918 Other nonspecific abnormal finding of lung field: Secondary | ICD-10-CM

## 2021-10-17 NOTE — Patient Instructions (Addendum)
We will call you in August 2023 to schedule CT chest. If you do not hear from our office please call so we can get this scheduled.  ? ? Referral sent to Dr.Dillenham. Someone from their office will call you to schedule an appointment. ?

## 2021-10-19 ENCOUNTER — Encounter: Payer: Self-pay | Admitting: Surgery

## 2021-10-19 NOTE — Progress Notes (Signed)
Outpatient Surgical Follow Up ? ?10/19/2021 ? ?Phyllis Andrews is an 71 y.o. female.  ? ?Chief Complaint  ?Patient presents with  ? Follow-up  ?  Lung nodule  ? ? ?HPI: Phyllis Andrews is a 71 year old female patient of Dr. Faith Rogue that was seen for right pulmonary nodule on the right lower lobe. ?She elected to have surveillance and had a recent CT of the chest that have personally reviewed showing a stable 2.4 cm nodule right lower lobe and a new nodule that measures 5 mm. ?She denies any fevers or chills or hemoptysis.  She does endorse some cough. ?Also of note there is evidence of rupture of bilateral breast implants.  Discussing with her she does not recall what material was used for the breast implants since it was years ago.  Specifically she denies any symptoms attributed to her chest wall or breast. ? ?Past Medical History:  ?Diagnosis Date  ? GERD (gastroesophageal reflux disease)   ? Hypercholesteremia   ? Laryngeal spasm   ? Meningioma ([place])   ? Paralyzed vocal cords   ? ? ?Past Surgical History:  ?Procedure Laterality Date  ? CATARACT EXTRACTION    ? COLONOSCOPY WITH PROPOFOL N/A 06/06/2021  ? Procedure: COLONOSCOPY WITH PROPOFOL;  Surgeon: Lesly Rubenstein, MD;  Location: [facility] ENDOSCOPY;  Service: Endoscopy;  Laterality: N/A;  ? menigioma  2018  ? PLACEMENT OF BREAST IMPLANTS    ? resection of intracranial mass    ? TONSILECTOMY, ADENOIDECTOMY, BILATERAL MYRINGOTOMY AND TUBES    ? ? ?Family History  ?Problem Relation Age of Onset  ? Dementia Mother   ? Dementia Father   ? Prostate cancer Brother   ? ? ?Social History:  reports that she has never smoked. She has never used smokeless tobacco. She reports that she does not drink alcohol and does not use drugs. ? ?Allergies:  ?Allergies  ?Allergen Reactions  ? Methylprednisolone Acetate Other (See Comments)  ?  Severe facial swelling  ? ? ?Medications reviewed. ? ? ? ?ROS ?Full ROS performed and is otherwise negative other than what is stated in HPI ? ? ?BP  126/74   Pulse 86   Temp 98.1 ?F (36.7 ?C) (Oral)   Ht '5\' 7"'$  (1.702 m)   Wt 170 lb 3.2 oz (77.2 kg)   SpO2 93%   BMI 26.66 kg/m?  ? ?Physical Exam ?Vitals and nursing note reviewed. Exam conducted with a chaperone present.  ?Constitutional:   ?   General: She is not in acute distress. ?   Appearance: Normal appearance.  ?Cardiovascular:  ?   Rate and Rhythm: Normal rate and regular rhythm.  ?Pulmonary:  ?   Effort: Pulmonary effort is normal. No respiratory distress.  ?   Breath sounds: Normal breath sounds. No stridor. No rhonchi.  ?   Comments: She politely declines a breast exam during this visit ?Abdominal:  ?   General: Abdomen is flat. There is no distension.  ?   Palpations: Abdomen is soft. There is no mass.  ?   Tenderness: There is no abdominal tenderness.  ?   Hernia: No hernia is present.  ?Musculoskeletal:     ?   General: Normal range of motion.  ?   Cervical back: Normal range of motion and neck supple. No rigidity or tenderness.  ?Lymphadenopathy:  ?   Cervical: No cervical adenopathy.  ?Skin: ?   General: Skin is warm and dry.  ?   Capillary Refill: Capillary refill takes less than  2 seconds.  ?Neurological:  ?   General: No focal deficit present.  ?   Mental Status: She is alert and oriented to person, place, and time.  ?Psychiatric:     ?   Mood and Affect: Mood normal.     ?   Behavior: Behavior normal.     ?   Thought Content: Thought content normal.     ?   Judgment: Judgment normal.  ? ? ? ?Assessment/Plan: ?71 year old female with a 2.4 cm right lower lobe nodule that has been stable for over a year.  She wishes to continue surveillance with a CT scan and we will perform another CT scan in 6 months.  Regarding her breast implants I did encourage her to get referral to plastic surgery .  Potential complications may arise from foreign material leaking into the subcutaneous tissue.  Arrangements made for plastic surgery referral. ?Please note I spent 40 minutes in this encounter including  personally reviewing imaging studies, coordinating her care, placing orders, counseling the patient and performing appropriate documentation ? ?Caroleen Hamman, MD FACS ?General Surgeon  ?

## 2021-10-21 ENCOUNTER — Encounter: Payer: Self-pay | Admitting: Plastic Surgery

## 2021-10-21 ENCOUNTER — Other Ambulatory Visit: Payer: Self-pay

## 2021-10-21 ENCOUNTER — Ambulatory Visit: Payer: Medicare PPO | Admitting: Plastic Surgery

## 2021-10-21 DIAGNOSIS — T8543XA Leakage of breast prosthesis and implant, initial encounter: Secondary | ICD-10-CM | POA: Diagnosis not present

## 2021-10-21 NOTE — Progress Notes (Signed)
? ?  Patient ID: Phyllis Andrews, female    DOB: 30-Jul-1950, 71 y.o.   MRN: [id number] ? ? ?Chief Complaint  ?Patient presents with  ? Breast Problem  ? ? ?The patient is a 71 year old lovely female here for evaluation of her breasts.  She had silicone breast implants placed around 1987 at [facility] by Dr. Tanna Savoy.  She was in a cup prior to the implants and is likely around a B to C cup now.  She believes they were under the muscle.  She does not have diabetes and is not a smoker.  She is not on any blood thinner.  She had a mammogram in July and it was negative other than a rupture of the implants.  The left side is definitely ruptured and may be the right.  She is interested in having them removed and replaced.  She is 5 feet 7 inches tall and weighs 170 pounds.  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  She had some residual issues afterwards with voice and possible stroke but not confirmed.  She does have bilateral ptosis of her breasts. ? ? ?Review of Systems  ?Constitutional: Negative.   ?Eyes: Negative.   ?Respiratory: Negative.    ?Cardiovascular: Negative.   ?Gastrointestinal: Negative.   ?Endocrine: Negative.   ?Genitourinary: Negative.   ?Musculoskeletal: Negative.   ?Hematological: Negative.   ?Psychiatric/Behavioral: Negative.    ? ?Past Medical History:  ?Diagnosis Date  ? GERD (gastroesophageal reflux disease)   ? Hypercholesteremia   ? Laryngeal spasm   ? Meningioma ([place])   ? Paralyzed vocal cords   ?  ?Past Surgical History:  ?Procedure Laterality Date  ? CATARACT EXTRACTION    ? COLONOSCOPY WITH PROPOFOL N/A 06/06/2021  ? Procedure: COLONOSCOPY WITH PROPOFOL;  Surgeon: Lesly Rubenstein, MD;  Location: [facility] ENDOSCOPY;  Service: Endoscopy;  Laterality: N/A;  ? menigioma  2018  ? PLACEMENT OF BREAST IMPLANTS    ? resection of intracranial mass    ? TONSILECTOMY, ADENOIDECTOMY, BILATERAL MYRINGOTOMY AND TUBES    ?  ? ? ?Current Outpatient Medications:  ?  acetaminophen (TYLENOL) 325 MG tablet,  Take 325 mg by mouth every 6 (six) hours as needed. , Disp: , Rfl:  ?  calcium carbonate (OSCAL) 1500 (600 Ca) MG TABS tablet, Take by mouth 2 (two) times daily with a meal., Disp: , Rfl:  ?  Cholecalciferol 25 MCG (1000 UT) tablet, Take by mouth., Disp: , Rfl:  ?  Coenzyme Q10 10 MG capsule, Take by mouth., Disp: , Rfl:  ?  famotidine (PEPCID) 10 MG tablet, Take by mouth., Disp: , Rfl:  ?  fluticasone (FLONASE) 50 MCG/ACT nasal spray, Place into the nose., Disp: , Rfl:  ?  LACTOBACILLUS PROBIOTIC PO, Take by mouth., Disp: , Rfl:  ?  Omega-3 Fatty Acids (RA FISH OIL) 1000 MG CAPS, Take by mouth., Disp: , Rfl:  ?  pravastatin (PRAVACHOL) 20 MG tablet, Take 20 mg by mouth daily., Disp: , Rfl:  ?  psyllium (METAMUCIL) 58.6 % packet, Take by mouth., Disp: , Rfl:  ?  vitamin C (ASCORBIC ACID) 500 MG tablet, Take 500 mg by mouth daily., Disp: , Rfl:   ? ?Objective:  ? ?There were no vitals filed for this visit. ? ?Physical Exam ?Vitals reviewed.  ?Constitutional:   ?   Appearance: Normal appearance.  ?HENT:  ?   Head: Normocephalic and atraumatic.  ?Cardiovascular:  ?   Rate and Rhythm: Normal rate.  ?  Pulses: Normal pulses.  ?Pulmonary:  ?   Effort: Pulmonary effort is normal.  ?Abdominal:  ?   General: There is no distension.  ?   Palpations: Abdomen is soft.  ?Skin: ?   General: Skin is warm.  ?   Capillary Refill: Capillary refill takes less than 2 seconds.  ?   Coloration: Skin is not jaundiced.  ?   Findings: No bruising.  ?Neurological:  ?   Mental Status: She is alert and oriented to person, place, and time.  ?Psychiatric:     ?   Mood and Affect: Mood normal.     ?   Behavior: Behavior normal.     ?   Thought Content: Thought content normal.  ? ? ?Assessment & Plan:  ?Ruptured silicone breast implant, initial encounter ? ?Patient is interested in removal of ruptured implants with replacement.  She knows insurance would not cover the replacement or the mastopexy.  We will submit for removal of the ruptured  implants and see what we can work out. ? ?Pictures were obtained of the patient and placed in the chart with the patient's or guardian's permission. ? ? ?Loel Lofty Kristell Wooding, DO ?

## 2021-10-28 ENCOUNTER — Ambulatory Visit: Payer: Medicare PPO

## 2021-12-15 ENCOUNTER — Telehealth: Payer: Self-pay | Admitting: Plastic Surgery

## 2021-12-15 NOTE — Telephone Encounter (Signed)
LVM to discuss scheduling surgery.

## 2022-01-05 ENCOUNTER — Encounter: Payer: Self-pay | Admitting: Plastic Surgery

## 2022-01-05 ENCOUNTER — Telehealth (INDEPENDENT_AMBULATORY_CARE_PROVIDER_SITE_OTHER): Payer: Medicare PPO | Admitting: Plastic Surgery

## 2022-01-05 DIAGNOSIS — T8543XA Leakage of breast prosthesis and implant, initial encounter: Secondary | ICD-10-CM

## 2022-01-05 NOTE — Progress Notes (Signed)
   Subjective:    Patient ID: Phyllis Andrews, female    DOB: 06/04/1951, 71 y.o.   MRN: [id number]  The patient is a 71 yrs old female joining me by video.  She is interested in removal of her ruptured breast implants. She had them placed ~ 1987-1988.  She does not have DM and is not a smoker. She is trying to decide if she wants to have the implants removed and replaced versus just removed.  Also thinking about a mastopexy.  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 She is 5 feet 7 inches tall and weighs 170 pounds. She has capsule contracture of the breasts.      Review of Systems  Constitutional: Negative.   Eyes: Negative.   Respiratory: Negative.    Cardiovascular: Negative.   Gastrointestinal: Negative.   Endocrine: Negative.   Genitourinary: Negative.        Objective:   Physical Exam     Assessment & Plan:     ICD-10-CM   1. Ruptured silicone breast implant, initial encounter  T85.43XA        I connected with  Io Kassel on 01/05/22 by a video enabled telemedicine application and verified that I am speaking with the correct person using two identifiers. The patient was at home and I was at the office.    I discussed the limitations of evaluation and management by telemedicine. The patient expressed understanding and agreed to proceed.  The patient is still deciding on what to do regarding implant removal /replacement and mastopexy and implant replacement. We spent 15 min in discussion and I spent 10 additional minutes in documentation.  She will come see [place] in August and get her mammogram.

## 2022-01-09 ENCOUNTER — Ambulatory Visit: Payer: Medicare PPO | Admitting: Plastic Surgery

## 2022-01-23 ENCOUNTER — Other Ambulatory Visit: Payer: Self-pay | Admitting: Family Medicine

## 2022-01-23 DIAGNOSIS — N61 Mastitis without abscess: Secondary | ICD-10-CM

## 2022-01-23 DIAGNOSIS — Z1231 Encounter for screening mammogram for malignant neoplasm of breast: Secondary | ICD-10-CM

## 2022-01-27 ENCOUNTER — Inpatient Hospital Stay
Admission: RE | Admit: 2022-01-27 | Discharge: 2022-01-27 | Disposition: A | Discharge status: Home / Self Care | Payer: Self-pay | Source: Ambulatory Visit | Attending: *Deleted | Admitting: *Deleted

## 2022-01-27 ENCOUNTER — Other Ambulatory Visit: Payer: Self-pay | Admitting: *Deleted

## 2022-01-27 DIAGNOSIS — Z1231 Encounter for screening mammogram for malignant neoplasm of breast: Secondary | ICD-10-CM

## 2022-01-31 ENCOUNTER — Other Ambulatory Visit: Payer: Self-pay | Admitting: Family Medicine

## 2022-01-31 DIAGNOSIS — N61 Mastitis without abscess: Secondary | ICD-10-CM

## 2022-02-02 ENCOUNTER — Ambulatory Visit
Admission: RE | Admit: 2022-02-02 | Discharge: 2022-02-02 | Disposition: A | Discharge status: Home / Self Care | Payer: Medicare PPO | Source: Ambulatory Visit | Attending: Family Medicine | Admitting: Family Medicine

## 2022-02-02 ENCOUNTER — Telehealth: Payer: Self-pay | Admitting: Plastic Surgery

## 2022-02-02 DIAGNOSIS — Z1231 Encounter for screening mammogram for malignant neoplasm of breast: Secondary | ICD-10-CM

## 2022-02-02 DIAGNOSIS — N61 Mastitis without abscess: Secondary | ICD-10-CM | POA: Diagnosis present

## 2022-02-02 NOTE — Telephone Encounter (Signed)
Pt is calling in stating that she would like to let Dr. Marla Roe know that her R breast is not only ruptured but she has free silicon roaming inside of her breast.  She would like to know what she should do about it and if she can have surgery to correct the situation very soon.  Pt would like to have a call back.

## 2022-02-07 ENCOUNTER — Encounter: Payer: Self-pay | Admitting: Plastic Surgery

## 2022-02-07 ENCOUNTER — Ambulatory Visit (INDEPENDENT_AMBULATORY_CARE_PROVIDER_SITE_OTHER): Payer: Medicare PPO | Admitting: Plastic Surgery

## 2022-02-07 DIAGNOSIS — T8543XA Leakage of breast prosthesis and implant, initial encounter: Secondary | ICD-10-CM | POA: Diagnosis not present

## 2022-02-07 NOTE — Progress Notes (Addendum)
   Subjective:    Patient ID: Phyllis Andrews, female    DOB: [birth year], 71 y.o.   MRN: [id number]   The patient is a 71 year old female joining me by phone for discussion about her breasts.  She had silicone breast implants placed in 1987 at [facility].  She was an A cup prior to surgery and is now between a B and a C cup.  She thinks that they are under the muscle.  She does not have diabetes and she is not on any blood thinner.  She had a mammogram in July which was negative for any concerning areas just a rupture however over the past few days she developed some redness which her friend thought was cellulitis.  She had a repeat mammogram which showed silicone had leaked out of the capsule.  She now wants to go ahead with removal.  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  Her breasts have bilateral ptosis but she does not want to wait to figure out mastopexy.  I think this is probably a smart move and to go ahead and get the silicone out of her breast area before it turns firm.     Review of Systems  Skin:  Positive for color change.       Objective:   Physical Exam      Assessment & Plan:     ICD-10-CM   1. Ruptured silicone breast implant, initial encounter  T85.43XA       I connected with  Phyllis Andrews on 02/07/22 by phone and verified that I am speaking with the correct person using two identifiers. The patient was at home and I was at the office.   I discussed the limitations of evaluation and management by telemedicine. The patient expressed understanding and agreed to proceed.  We spent 5 minutes in discussion on the phone.  I reviewed the ultrasound of the head with planning surgery for removal of bilateral ruptured implants due to the possibility of this getting worse.  Patient is in agreement.

## 2022-02-14 ENCOUNTER — Other Ambulatory Visit: Payer: Medicare PPO

## 2022-02-21 ENCOUNTER — Other Ambulatory Visit: Payer: Self-pay

## 2022-02-21 ENCOUNTER — Ambulatory Visit: Payer: Medicare PPO | Admitting: Physical Therapy

## 2022-02-21 DIAGNOSIS — J9859 Other diseases of mediastinum, not elsewhere classified: Secondary | ICD-10-CM

## 2022-02-21 DIAGNOSIS — R911 Solitary pulmonary nodule: Secondary | ICD-10-CM

## 2022-02-22 NOTE — H&P (View-Only) (Signed)
Patient ID: Phyllis Andrews, female    DOB: 1951/03/18, 71 y.o.   MRN: [id number]  Chief Complaint  Patient presents with   Pre-op Exam      ICD-10-CM   1. Ruptured silicone breast implant, initial encounter  T85.43XA       History of Present Illness: Phyllis Andrews is a 71 y.o.  female  with a history of silicone breast implants placed in 1987 for breast augmentation.  She presents for preoperative evaluation for upcoming procedure, removal of bilateral breast implants, scheduled for 03/08/2022 with Dr. Marla Roe.  The patient has not had problems with anesthesia. No history of DVT/PE.  No family history of DVT/PE.  No family or personal history of bleeding or clotting disorders.  Patient is not currently taking any blood thinners.  No cardiac history.  She reports a questionable history of a stroke after meningioma surgery, however she is not totally sure if it is a stroke or not, she reports she never received a definitive answer if it was related to surgery or not.  Summary of Previous Visit: Patient with silicone breast implants placed in 1987 at [facility].  She thinks they are under the muscle.  She is not diabetic, not on blood thinners.  Mammogram in July, negative for anything concerning, but she does have ruptured implants.  PMH Significant for: Bilateral breast implants placed in 1987.  Meningioma.  GERD.  Patient reports she is feeling well, she has not had any changes to her health.  She does not have any cardiac or pulmonary symptoms.  She is on some supplements which she is willing to stop prior to surgery.  She had a mammogram on 02/02/2022 which showed which showed large amount of free silicone throughout the right breast extending to and likely involving the skin.  No suspicious masses or calcifications in either breast. She did recently have a bout of right breast cellulitis which was treated and seems to be resolving, however she reports that she has noticed some areas of skin  changes along the right lateral breast that concern her.  Past Medical History: Allergies: Allergies  Allergen Reactions   Methylprednisolone Acetate Other (See Comments)    Severe facial swelling    Current Medications:  Current Outpatient Medications:    acetaminophen (TYLENOL) 325 MG tablet, Take 325 mg by mouth every 6 (six) hours as needed. , Disp: , Rfl:    calcium carbonate (OSCAL) 1500 (600 Ca) MG TABS tablet, Take by mouth 2 (two) times daily with a meal., Disp: , Rfl:    Cholecalciferol 25 MCG (1000 UT) tablet, Take by mouth., Disp: , Rfl:    Coenzyme Q10 10 MG capsule, Take by mouth., Disp: , Rfl:    famotidine (PEPCID) 10 MG tablet, Take by mouth., Disp: , Rfl:    LACTOBACILLUS PROBIOTIC PO, Take by mouth., Disp: , Rfl:    Omega-3 Fatty Acids (RA FISH OIL) 1000 MG CAPS, Take by mouth., Disp: , Rfl:    pravastatin (PRAVACHOL) 20 MG tablet, Take 20 mg by mouth daily., Disp: , Rfl:    psyllium (METAMUCIL) 58.6 % packet, Take by mouth., Disp: , Rfl:    vitamin C (ASCORBIC ACID) 500 MG tablet, Take 500 mg by mouth daily., Disp: , Rfl:   Past Medical Problems: Past Medical History:  Diagnosis Date   GERD (gastroesophageal reflux disease)    Hypercholesteremia    Laryngeal spasm    Meningioma (HCC)    Paralyzed vocal cords  Past Surgical History: Past Surgical History:  Procedure Laterality Date   CATARACT EXTRACTION     COLONOSCOPY WITH PROPOFOL N/A 06/06/2021   Procedure: COLONOSCOPY WITH PROPOFOL;  Surgeon: Lesly Rubenstein, MD;  Location: [facility] ENDOSCOPY;  Service: Endoscopy;  Laterality: N/A;   menigioma  2018   PLACEMENT OF BREAST IMPLANTS     resection of intracranial mass     TONSILECTOMY, ADENOIDECTOMY, BILATERAL MYRINGOTOMY AND TUBES      Social History: Social History   Socioeconomic History   Marital status: Married    Spouse name: Not on file   Number of children: Not on file   Years of education: Not on file   Highest education level: Not  on file  Occupational History   Not on file  Tobacco Use   Smoking status: Never   Smokeless tobacco: Never  Vaping Use   Vaping Use: Never used  Substance and Sexual Activity   Alcohol use: Never   Drug use: Never   Sexual activity: Not on file  Other Topics Concern   Not on file  Social History Narrative   Not on file   Social Determinants of Health   Financial Resource Strain: Not on file  Food Insecurity: Not on file  Transportation Needs: Not on file  Physical Activity: Not on file  Stress: Not on file  Social Connections: Not on file  Intimate Partner Violence: Not on file    Family History: Family History  Problem Relation Age of Onset   Dementia Mother    Dementia Father    Prostate cancer Brother     Review of Systems: Review of Systems  Constitutional: Negative.   Respiratory: Negative.    Cardiovascular: Negative.     Physical Exam: Vital Signs BP (!) 144/83 (BP Location: Left Arm, Patient Position: Sitting, Cuff Size: Small)   Pulse 75   Ht '5\' 5"'$  (1.651 m)   Wt 168 lb 12.8 oz (76.6 kg)   SpO2 94%   BMI 28.09 kg/m   Physical Exam  Constitutional:      General: Not in acute distress.    Appearance: Normal appearance. Not ill-appearing.  HENT:     Head: Normocephalic and atraumatic.  Eyes:     Pupils: Pupils are equal, round Neck:     Musculoskeletal: Normal range of motion.  Cardiovascular:     Rate and Rhythm: Normal rate    Pulses: Normal pulses.  Pulmonary:     Effort: Pulmonary effort is normal. No respiratory distress.  Breast: Right breast with pink lesions noted scattered throughout the left lateral breast. Abdominal:     General: Abdomen is flat. There is no distension.  Musculoskeletal: Normal range of motion.  Skin:    General: Skin is warm and dry.     Findings: No erythema or rash.  Neurological:     General: No focal deficit present.     Mental Status: Alert and oriented to person, place, and time. Mental status is at  baseline.     Motor: No weakness.  Psychiatric:        Mood and Affect: Mood normal.        Behavior: Behavior normal.    Assessment/Plan: The patient is scheduled for removal of bilateral ruptured breast implants with Dr. Marla Roe.  Risks, benefits, and alternatives of procedure discussed, questions answered and consent obtained.    Smoking Status: Non-smoker; Counseling Given?  N/A Last Mammogram: 01/2022; Results: Bilateral retropectoral implants in place, large amount of free  silicone throughout the right breast extending to and likely involving the skin.  No suspicious masses or calcifications in either breast.  Caprini Score: 5, high; Risk Factors include: Age, BMI greater than 25, and length of planned surgery. Recommendation for mechanical prophylaxis. Encourage early ambulation.   Pictures obtained: '@consult'$   Post-op Rx sent to pharmacy: Oxycodone, Zofran, Keflex  Patient was provided with the General Surgical Risk consent document and Pain Medication Agreement prior to their appointment.  They had adequate time to read through the risk consent documents and Pain Medication Agreement. We also discussed them in person together during this preop appointment. All of their questions were answered to their satisfaction.  Recommended calling if they have any further questions.  Risk consent form and Pain Medication Agreement to be scanned into patient's chart.  We discussed the risks associated specifically with implant removal including significant volume loss, significant breast ptosis.  We discussed she is at an increased risk of postoperative complications related to her removal of bilateral breast implants, right greater than left due to the extracapsular rupture and the silicone that is likely involving the skin of her right breast.  Reviewed mammogram today with Dr. Marla Roe, there is what appears to be extensive silicone within the extracapsular space, possibly within the skin.   I subsequently called the patient to discuss and notified patient that some of the breast tissue may need to be excised intraoperatively to remove the silicone, she was understanding of this and we discussed the increased risk of postoperative scarring, distortion of her breast due to this.  She was understanding of this.  All of her questions related to this were answered.  Electronically signed by: Carola Rhine Mahkai Fangman, PA-C 02/23/2022 12:37 PM

## 2022-02-22 NOTE — Progress Notes (Unsigned)
Patient ID: Phyllis Andrews, female    DOB: [birth year], 71 y.o.   MRN: [id number]  No chief complaint on file.   No diagnosis found.   History of Present Illness: Phyllis Andrews is a 71 y.o.  female  with a history of ***.  She presents for preoperative evaluation for upcoming procedure, removal of bilateral breast implants, scheduled for 03/08/2022 with Dr. Marla Roe.  The patient {HAS HAS HEN:[id number]} had problems with anesthesia. ***  Summary of Previous Visit: Patient with silicone breast implants placed in 1987 at [facility].  She thinks they are under the muscle.  She is not diabetic, not on blood thinners.  Mammogram in July, negative for anything concerning, but she does have ruptured implants.  Job: ***  PMH Significant for: Bilateral breast implants placed in 1987.  Meningioma.  GERD.   Past Medical History: Allergies: Allergies  Allergen Reactions   Methylprednisolone Acetate Other (See Comments)    Severe facial swelling    Current Medications:  Current Outpatient Medications:    acetaminophen (TYLENOL) 325 MG tablet, Take 325 mg by mouth every 6 (six) hours as needed. , Disp: , Rfl:    calcium carbonate (OSCAL) 1500 (600 Ca) MG TABS tablet, Take by mouth 2 (two) times daily with a meal., Disp: , Rfl:    Cholecalciferol 25 MCG (1000 UT) tablet, Take by mouth., Disp: , Rfl:    Coenzyme Q10 10 MG capsule, Take by mouth., Disp: , Rfl:    famotidine (PEPCID) 10 MG tablet, Take by mouth., Disp: , Rfl:    fluticasone (FLONASE) 50 MCG/ACT nasal spray, Place into the nose., Disp: , Rfl:    LACTOBACILLUS PROBIOTIC PO, Take by mouth., Disp: , Rfl:    Omega-3 Fatty Acids (RA FISH OIL) 1000 MG CAPS, Take by mouth., Disp: , Rfl:    pravastatin (PRAVACHOL) 20 MG tablet, Take 20 mg by mouth daily., Disp: , Rfl:    psyllium (METAMUCIL) 58.6 % packet, Take by mouth., Disp: , Rfl:    vitamin C (ASCORBIC ACID) 500 MG tablet, Take 500 mg by mouth daily., Disp: , Rfl:   Past Medical  Problems: Past Medical History:  Diagnosis Date   GERD (gastroesophageal reflux disease)    Hypercholesteremia    Laryngeal spasm    Meningioma (HCC)    Paralyzed vocal cords     Past Surgical History: Past Surgical History:  Procedure Laterality Date   CATARACT EXTRACTION     COLONOSCOPY WITH PROPOFOL N/A 06/06/2021   Procedure: COLONOSCOPY WITH PROPOFOL;  Surgeon: Lesly Rubenstein, MD;  Location: [facility] ENDOSCOPY;  Service: Endoscopy;  Laterality: N/A;   menigioma  2018   PLACEMENT OF BREAST IMPLANTS     resection of intracranial mass     TONSILECTOMY, ADENOIDECTOMY, BILATERAL MYRINGOTOMY AND TUBES      Social History: Social History   Socioeconomic History   Marital status: Married    Spouse name: Not on file   Number of children: Not on file   Years of education: Not on file   Highest education level: Not on file  Occupational History   Not on file  Tobacco Use   Smoking status: Never   Smokeless tobacco: Never  Vaping Use   Vaping Use: Never used  Substance and Sexual Activity   Alcohol use: Never   Drug use: Never   Sexual activity: Not on file  Other Topics Concern   Not on file  Social History Narrative   Not on file  Social Determinants of Health   Financial Resource Strain: Not on file  Food Insecurity: Not on file  Transportation Needs: Not on file  Physical Activity: Not on file  Stress: Not on file  Social Connections: Not on file  Intimate Partner Violence: Not on file    Family History: Family History  Problem Relation Age of Onset   Dementia Mother    Dementia Father    Prostate cancer Brother     Review of Systems: ROS  Physical Exam: Vital Signs There were no vitals taken for this visit.  Physical Exam *** Constitutional:      General: Not in acute distress.    Appearance: Normal appearance. Not ill-appearing.  HENT:     Head: Normocephalic and atraumatic.  Eyes:     Pupils: Pupils are equal, round Neck:      Musculoskeletal: Normal range of motion.  Cardiovascular:     Rate and Rhythm: Normal rate    Pulses: Normal pulses.  Pulmonary:     Effort: Pulmonary effort is normal. No respiratory distress.  Abdominal:     General: Abdomen is flat. There is no distension.  Musculoskeletal: Normal range of motion.  Skin:    General: Skin is warm and dry.     Findings: No erythema or rash.  Neurological:     General: No focal deficit present.     Mental Status: Alert and oriented to person, place, and time. Mental status is at baseline.     Motor: No weakness.  Psychiatric:        Mood and Affect: Mood normal.        Behavior: Behavior normal.    Assessment/Plan: The patient is scheduled for removal of bilateral ruptured breast implants with Dr. Marla Roe.  Risks, benefits, and alternatives of procedure discussed, questions answered and consent obtained.    Smoking Status: ***; Counseling Given? *** Last Mammogram: ***; Results: ***  Caprini Score: ***; Risk Factors include: ***, BMI *** 25, and length of planned surgery. Recommendation for mechanical *** prophylaxis. Encourage early ambulation.   Pictures obtained: '@consult'$   Post-op Rx sent to pharmacy: {Blank:[id number]::"Oxycodone, Zofran, Keflex","Oxycodone, Zofran"}  Patient was provided with the *** General Surgical Risk consent document and Pain Medication Agreement prior to their appointment.  They had adequate time to read through the risk consent documents and Pain Medication Agreement. We also discussed them in person together during this preop appointment. All of their questions were answered to their satisfaction.  Recommended calling if they have any further questions.  Risk consent form and Pain Medication Agreement to be scanned into patient's chart.  ***   Electronically signed by: Carola Rhine Drinda Belgard, PA-C 02/22/2022 2:30 PM

## 2022-02-23 ENCOUNTER — Encounter: Payer: Self-pay | Admitting: Surgical

## 2022-02-23 ENCOUNTER — Ambulatory Visit (INDEPENDENT_AMBULATORY_CARE_PROVIDER_SITE_OTHER): Payer: Medicare PPO | Admitting: Surgical

## 2022-02-23 VITALS — BP 144/83 | HR 75 | Ht 65.0 in | Wt 168.8 lb

## 2022-02-23 DIAGNOSIS — T8543XA Leakage of breast prosthesis and implant, initial encounter: Secondary | ICD-10-CM

## 2022-02-23 MED ORDER — CEPHALEXIN 500 MG PO CAPS: 500.0000 mg | ORAL_CAPSULE | Freq: Four times a day (QID) | ORAL | 0 refills | Status: AC

## 2022-02-23 MED ORDER — ONDANSETRON HCL 4 MG PO TABS: 4.0000 mg | ORAL_TABLET | Freq: Three times a day (TID) | ORAL | 0 refills | Status: DC | PRN

## 2022-02-23 MED ORDER — OXYCODONE HCL 5 MG PO TABS: 5.0000 mg | ORAL_TABLET | Freq: Four times a day (QID) | ORAL | 0 refills | Status: AC | PRN

## 2022-02-24 ENCOUNTER — Telehealth: Payer: Self-pay

## 2022-02-24 NOTE — Telephone Encounter (Signed)
Left message for the patient to call back and let us know a good day or time to have her CT of the Chest scheduled and follow up with Dr Dahlia Byes.

## 2022-02-24 NOTE — Telephone Encounter (Signed)
Patient called back and would like this done around the first part of October. As our schedule is not open yet for October I will call her at that time to get her scheduled for this.

## 2022-02-28 ENCOUNTER — Encounter: Payer: Medicare PPO | Admitting: Physical Therapy

## 2022-02-28 ENCOUNTER — Encounter (HOSPITAL_BASED_OUTPATIENT_CLINIC_OR_DEPARTMENT_OTHER): Payer: Self-pay | Admitting: Plastic Surgery

## 2022-02-28 ENCOUNTER — Other Ambulatory Visit: Payer: Self-pay

## 2022-03-07 ENCOUNTER — Encounter: Payer: Medicare PPO | Admitting: Physical Therapy

## 2022-03-07 NOTE — Anesthesia Preprocedure Evaluation (Signed)
Anesthesia Evaluation  Patient identified by MRN, date of birth, ID band Patient awake    Reviewed: Allergy & Precautions, NPO status , Patient's Chart, lab work & pertinent test results  Airway Mallampati: II  TM Distance: >3 FB Neck ROM: Full    Dental no notable dental hx. (+) Teeth Intact, Dental Advisory Given   Pulmonary neg pulmonary ROS,    Pulmonary exam normal breath sounds clear to auscultation       Cardiovascular Exercise Tolerance: Good negative cardio ROS Normal cardiovascular exam Rhythm:Regular Rate:Normal     Neuro/Psych negative neurological ROS  negative psych ROS   GI/Hepatic negative GI ROS, Neg liver ROS, GERD  ,  Endo/Other  negative endocrine ROS  Renal/GU negative Renal ROS  negative genitourinary   Musculoskeletal negative musculoskeletal ROS (+)   Abdominal   Peds negative pediatric ROS (+)  Hematology negative hematology ROS (+)   Anesthesia Other Findings Methylprednisolone  Reproductive/Obstetrics negative OB ROS                            Anesthesia Physical Anesthesia Plan  ASA: 3  Anesthesia Plan: General   Post-op Pain Management: Tylenol PO (pre-op)* and Precedex   Induction: Intravenous  PONV Risk Score and Plan: 3 and Treatment may vary due to age or medical condition, Ondansetron and Midazolam  Airway Management Planned: Oral ETT  Additional Equipment: None  Intra-op Plan:   Post-operative Plan: Extubation in OR  Informed Consent: I have reviewed the patients History and Physical, chart, labs and discussed the procedure including the risks, benefits and alternatives for the proposed anesthesia with the patient or authorized representative who has indicated his/her understanding and acceptance.     Dental advisory given  Plan Discussed with:   Anesthesia Plan Comments:        Anesthesia Quick Evaluation

## 2022-03-08 ENCOUNTER — Ambulatory Visit (HOSPITAL_BASED_OUTPATIENT_CLINIC_OR_DEPARTMENT_OTHER): Payer: Medicare PPO | Admitting: Anesthesiology

## 2022-03-08 ENCOUNTER — Encounter (HOSPITAL_BASED_OUTPATIENT_CLINIC_OR_DEPARTMENT_OTHER)
Admission: RE | Disposition: A | Discharge status: Home / Self Care | Payer: Self-pay | Source: Home / Self Care | Attending: Plastic Surgery

## 2022-03-08 ENCOUNTER — Encounter (HOSPITAL_BASED_OUTPATIENT_CLINIC_OR_DEPARTMENT_OTHER): Payer: Self-pay | Admitting: Plastic Surgery

## 2022-03-08 ENCOUNTER — Ambulatory Visit (HOSPITAL_BASED_OUTPATIENT_CLINIC_OR_DEPARTMENT_OTHER)
Admission: RE | Admit: 2022-03-08 | Discharge: 2022-03-08 | Disposition: A | Discharge status: Home / Self Care | Payer: Medicare PPO | Attending: Plastic Surgery | Admitting: Plastic Surgery

## 2022-03-08 ENCOUNTER — Other Ambulatory Visit: Payer: Self-pay

## 2022-03-08 DIAGNOSIS — K219 Gastro-esophageal reflux disease without esophagitis: Secondary | ICD-10-CM | POA: Diagnosis not present

## 2022-03-08 DIAGNOSIS — Y831 Surgical operation with implant of artificial internal device as the cause of abnormal reaction of the patient, or of later complication, without mention of misadventure at the time of the procedure: Secondary | ICD-10-CM | POA: Insufficient documentation

## 2022-03-08 DIAGNOSIS — T8549XA Other mechanical complication of breast prosthesis and implant, initial encounter: Secondary | ICD-10-CM | POA: Diagnosis not present

## 2022-03-08 DIAGNOSIS — T8543XA Leakage of breast prosthesis and implant, initial encounter: Secondary | ICD-10-CM | POA: Insufficient documentation

## 2022-03-08 DIAGNOSIS — T8541XA Breakdown (mechanical) of breast prosthesis and implant, initial encounter: Secondary | ICD-10-CM

## 2022-03-08 DIAGNOSIS — Z01818 Encounter for other preprocedural examination: Secondary | ICD-10-CM

## 2022-03-08 HISTORY — PX: BREAST IMPLANT REMOVAL: SHX5361

## 2022-03-08 SURGERY — REMOVAL, IMPLANT, BREAST
Anesthesia: General | Site: Breast | Laterality: Bilateral

## 2022-03-08 MED ORDER — FENTANYL CITRATE (PF) 100 MCG/2ML IJ SOLN
INTRAMUSCULAR | Status: AC
  Filled 2022-03-08: qty 2

## 2022-03-08 MED ORDER — EPHEDRINE SULFATE (PRESSORS) 50 MG/ML IJ SOLN
INTRAMUSCULAR | Status: DC | PRN
  Administered 2022-03-08: 10 mg via INTRAVENOUS
  Administered 2022-03-08: 5 mg via INTRAVENOUS

## 2022-03-08 MED ORDER — LIDOCAINE 2% (20 MG/ML) 5 ML SYRINGE
INTRAMUSCULAR | Status: AC
  Filled 2022-03-08: qty 5

## 2022-03-08 MED ORDER — BUPIVACAINE HCL (PF) 0.25 % IJ SOLN
INTRAMUSCULAR | Status: AC
  Filled 2022-03-08: qty 30

## 2022-03-08 MED ORDER — LIDOCAINE-EPINEPHRINE 1 %-1:100000 IJ SOLN
INTRAMUSCULAR | Status: AC
  Filled 2022-03-08: qty 1

## 2022-03-08 MED ORDER — CEFAZOLIN SODIUM-DEXTROSE 2-4 GM/100ML-% IV SOLN
INTRAVENOUS | Status: AC
  Filled 2022-03-08: qty 100

## 2022-03-08 MED ORDER — ONDANSETRON HCL 4 MG/2ML IJ SOLN: 4.0000 mg | Freq: Once | INTRAMUSCULAR | Status: DC | PRN

## 2022-03-08 MED ORDER — BUPIVACAINE-EPINEPHRINE (PF) 0.25% -1:200000 IJ SOLN
INTRAMUSCULAR | Status: AC
  Filled 2022-03-08: qty 30

## 2022-03-08 MED ORDER — HYDROMORPHONE HCL 1 MG/ML IJ SOLN
0.2500 mg | INTRAMUSCULAR | Status: DC | PRN
  Administered 2022-03-08 (×2): 0.5 mg via INTRAVENOUS

## 2022-03-08 MED ORDER — ACETAMINOPHEN 10 MG/ML IV SOLN
1000.0000 mg | Freq: Once | INTRAVENOUS | Status: DC | PRN
  Administered 2022-03-08: 1000 mg via INTRAVENOUS

## 2022-03-08 MED ORDER — HYDROMORPHONE HCL 1 MG/ML IJ SOLN
INTRAMUSCULAR | Status: AC
  Filled 2022-03-08: qty 0.5

## 2022-03-08 MED ORDER — LACTATED RINGERS IV SOLN: INTRAVENOUS | Status: DC

## 2022-03-08 MED ORDER — ACETAMINOPHEN 10 MG/ML IV SOLN
INTRAVENOUS | Status: AC
  Filled 2022-03-08: qty 100

## 2022-03-08 MED ORDER — ONDANSETRON HCL 4 MG/2ML IJ SOLN
INTRAMUSCULAR | Status: DC | PRN
  Administered 2022-03-08: 4 mg via INTRAVENOUS

## 2022-03-08 MED ORDER — PROPOFOL 10 MG/ML IV BOLUS
INTRAVENOUS | Status: AC
  Filled 2022-03-08: qty 20

## 2022-03-08 MED ORDER — DEXAMETHASONE SODIUM PHOSPHATE 10 MG/ML IJ SOLN
INTRAMUSCULAR | Status: AC
  Filled 2022-03-08: qty 1

## 2022-03-08 MED ORDER — LIDOCAINE-EPINEPHRINE 1 %-1:100000 IJ SOLN
INTRAMUSCULAR | Status: DC | PRN
  Administered 2022-03-08: 20 mL

## 2022-03-08 MED ORDER — CHLORHEXIDINE GLUCONATE CLOTH 2 % EX PADS: 6.0000 | MEDICATED_PAD | Freq: Once | CUTANEOUS | Status: DC

## 2022-03-08 MED ORDER — OXYCODONE HCL 5 MG PO TABS: 5.0000 mg | ORAL_TABLET | Freq: Once | ORAL | Status: DC | PRN

## 2022-03-08 MED ORDER — FENTANYL CITRATE (PF) 100 MCG/2ML IJ SOLN
INTRAMUSCULAR | Status: DC | PRN
  Administered 2022-03-08: 50 ug via INTRAVENOUS
  Administered 2022-03-08 (×2): 25 ug via INTRAVENOUS

## 2022-03-08 MED ORDER — EPHEDRINE 5 MG/ML INJ
INTRAVENOUS | Status: AC
  Filled 2022-03-08: qty 5

## 2022-03-08 MED ORDER — SODIUM CHLORIDE 0.9 % IV SOLN
INTRAVENOUS | Status: AC
  Filled 2022-03-08: qty 10

## 2022-03-08 MED ORDER — CEFAZOLIN SODIUM-DEXTROSE 2-4 GM/100ML-% IV SOLN
2.0000 g | INTRAVENOUS | Status: AC
  Administered 2022-03-08: 2 g via INTRAVENOUS

## 2022-03-08 MED ORDER — MIDAZOLAM HCL 5 MG/5ML IJ SOLN
INTRAMUSCULAR | Status: DC | PRN
  Administered 2022-03-08: 2 mg via INTRAVENOUS

## 2022-03-08 MED ORDER — ONDANSETRON HCL 4 MG/2ML IJ SOLN
INTRAMUSCULAR | Status: AC
  Filled 2022-03-08: qty 2

## 2022-03-08 MED ORDER — MIDAZOLAM HCL 2 MG/2ML IJ SOLN
INTRAMUSCULAR | Status: AC
  Filled 2022-03-08: qty 2

## 2022-03-08 MED ORDER — BUPIVACAINE HCL (PF) 0.25 % IJ SOLN
INTRAMUSCULAR | Status: DC | PRN
  Administered 2022-03-08: 30 mL

## 2022-03-08 MED ORDER — SODIUM CHLORIDE 0.9 % IV SOLN
INTRAVENOUS | Status: DC | PRN
  Administered 2022-03-08: 500 mL

## 2022-03-08 MED ORDER — PROPOFOL 10 MG/ML IV BOLUS
INTRAVENOUS | Status: DC | PRN
  Administered 2022-03-08: 180 mg via INTRAVENOUS

## 2022-03-08 MED ORDER — OXYCODONE HCL 5 MG/5ML PO SOLN: 5.0000 mg | Freq: Once | ORAL | Status: DC | PRN

## 2022-03-08 MED ORDER — LIDOCAINE HCL (CARDIAC) PF 100 MG/5ML IV SOSY
PREFILLED_SYRINGE | INTRAVENOUS | Status: DC | PRN
  Administered 2022-03-08: 100 mg via INTRATRACHEAL

## 2022-03-08 SURGICAL SUPPLY — 71 items
ADH SKN CLS APL DERMABOND .7 (GAUZE/BANDAGES/DRESSINGS)
BAG DECANTER FOR FLEXI CONT (MISCELLANEOUS) ×2 IMPLANT
BINDER BREAST LRG (GAUZE/BANDAGES/DRESSINGS) ×1 IMPLANT
BINDER BREAST MEDIUM (GAUZE/BANDAGES/DRESSINGS) IMPLANT
BINDER BREAST XLRG (GAUZE/BANDAGES/DRESSINGS) IMPLANT
BINDER BREAST XXLRG (GAUZE/BANDAGES/DRESSINGS) IMPLANT
BIOPATCH RED 1 DISK 7.0 (GAUZE/BANDAGES/DRESSINGS) IMPLANT
BLADE HEX COATED 2.75 (ELECTRODE) ×2 IMPLANT
BLADE SURG 15 STRL LF DISP TIS (BLADE) ×1 IMPLANT
BLADE SURG 15 STRL SS (BLADE) ×2
BNDG GAUZE DERMACEA FLUFF (GAUZE/BANDAGES/DRESSINGS) ×2
BNDG GAUZE DERMACEA FLUFF 4 (GAUZE/BANDAGES/DRESSINGS) ×2 IMPLANT
BNDG GZE DERMACEA 4 6PLY (GAUZE/BANDAGES/DRESSINGS) ×2
CANISTER SUCT 1200ML W/VALVE (MISCELLANEOUS) ×2 IMPLANT
COVER BACK TABLE 60X90IN (DRAPES) ×2 IMPLANT
COVER MAYO STAND STRL (DRAPES) ×2 IMPLANT
DERMABOND ADVANCED (GAUZE/BANDAGES/DRESSINGS)
DERMABOND ADVANCED .7 DNX12 (GAUZE/BANDAGES/DRESSINGS) IMPLANT
DRAIN CHANNEL 15F RND FF W/TCR (WOUND CARE) ×2 IMPLANT
DRAIN CHANNEL 19F RND (DRAIN) IMPLANT
DRAPE LAPAROSCOPIC ABDOMINAL (DRAPES) ×2 IMPLANT
DRSG PAD ABDOMINAL 8X10 ST (GAUZE/BANDAGES/DRESSINGS) ×2 IMPLANT
DRSG TEGADERM 2-3/8X2-3/4 SM (GAUZE/BANDAGES/DRESSINGS) ×3 IMPLANT
ELECT BLADE 4.0 EZ CLEAN MEGAD (MISCELLANEOUS)
ELECT BLADE 6.5 EXT (BLADE) IMPLANT
ELECT REM PT RETURN 9FT ADLT (ELECTROSURGICAL) ×2
ELECTRODE BLDE 4.0 EZ CLN MEGD (MISCELLANEOUS) IMPLANT
ELECTRODE REM PT RTRN 9FT ADLT (ELECTROSURGICAL) ×1 IMPLANT
EVACUATOR SILICONE 100CC (DRAIN) IMPLANT
GLOVE BIO SURGEON STRL SZ 6.5 (GLOVE) ×3 IMPLANT
GOWN STRL REUS W/ TWL LRG LVL3 (GOWN DISPOSABLE) ×2 IMPLANT
GOWN STRL REUS W/TWL LRG LVL3 (GOWN DISPOSABLE) ×4
IV NS 1000ML (IV SOLUTION)
IV NS 1000ML BAXH (IV SOLUTION) IMPLANT
IV NS 500ML (IV SOLUTION)
IV NS 500ML BAXH (IV SOLUTION) IMPLANT
KIT FILL ASEPTIC TRANSFER (MISCELLANEOUS) IMPLANT
NDL HYPO 25X1 1.5 SAFETY (NEEDLE) IMPLANT
NDL SAFETY ECLIPSE 18X1.5 (NEEDLE) IMPLANT
NEEDLE HYPO 18GX1.5 SHARP (NEEDLE) ×2
NEEDLE HYPO 25X1 1.5 SAFETY (NEEDLE) IMPLANT
NS IRRIG 1000ML POUR BTL (IV SOLUTION) IMPLANT
PACK BASIN DAY SURGERY FS (CUSTOM PROCEDURE TRAY) ×2 IMPLANT
PENCIL SMOKE EVACUATOR (MISCELLANEOUS) ×2 IMPLANT
PIN SAFETY STERILE (MISCELLANEOUS) IMPLANT
SLEEVE SCD COMPRESS KNEE MED (STOCKING) ×2 IMPLANT
SPIKE FLUID TRANSFER (MISCELLANEOUS) IMPLANT
SPONGE T-LAP 18X18 ~~LOC~~+RFID (SPONGE) ×5 IMPLANT
STRIP SUTURE WOUND CLOSURE 1/2 (MISCELLANEOUS) IMPLANT
SUT MNCRL AB 4-0 PS2 18 (SUTURE) ×2 IMPLANT
SUT MON AB 3-0 SH 27 (SUTURE) ×4
SUT MON AB 3-0 SH27 (SUTURE) ×1 IMPLANT
SUT MON AB 5-0 PS2 18 (SUTURE) ×2 IMPLANT
SUT PDS 3-0 CT2 (SUTURE) ×4
SUT PDS AB 2-0 CT2 27 (SUTURE) IMPLANT
SUT PDS II 3-0 CT2 27 ABS (SUTURE) IMPLANT
SUT PROLENE 3 0 PS 2 (SUTURE) IMPLANT
SUT SILK 3 0 PS 1 (SUTURE) ×2 IMPLANT
SUT VIC AB 3-0 SH 27 (SUTURE)
SUT VIC AB 3-0 SH 27X BRD (SUTURE) IMPLANT
SUT VICRYL 4-0 PS2 18IN ABS (SUTURE) IMPLANT
SWAB COLLECTION DEVICE MRSA (MISCELLANEOUS) IMPLANT
SWAB CULTURE ESWAB REG 1ML (MISCELLANEOUS) IMPLANT
SYR 50ML LL SCALE MARK (SYRINGE) IMPLANT
SYR BULB IRRIG 60ML STRL (SYRINGE) ×2 IMPLANT
SYR CONTROL 10ML LL (SYRINGE) ×1 IMPLANT
TOWEL GREEN STERILE FF (TOWEL DISPOSABLE) ×4 IMPLANT
TRAY DSU PREP LF (CUSTOM PROCEDURE TRAY) ×2 IMPLANT
TUBE CONNECTING 20X1/4 (TUBING) ×2 IMPLANT
UNDERPAD 30X36 HEAVY ABSORB (UNDERPADS AND DIAPERS) ×4 IMPLANT
YANKAUER SUCT BULB TIP NO VENT (SUCTIONS) ×2 IMPLANT

## 2022-03-08 NOTE — Anesthesia Postprocedure Evaluation (Signed)
Anesthesia Post Note  Patient: Phyllis Andrews  Procedure(s) Performed: REMOVAL BREAST IMPLANTS (Bilateral: Breast)     Patient location during evaluation: PACU Anesthesia Type: General Level of consciousness: awake and alert Pain management: pain level controlled Vital Signs Assessment: post-procedure vital signs reviewed and stable Respiratory status: spontaneous breathing, nonlabored ventilation, respiratory function stable and patient connected to nasal cannula oxygen Cardiovascular status: blood pressure returned to baseline and stable Postop Assessment: no apparent nausea or vomiting Anesthetic complications: no   No notable events documented.  Last Vitals:  Vitals:   03/08/22 1415 03/08/22 1430  BP: 125/60 125/65  Pulse: 72 73  Resp: 13 14  Temp:    SpO2: 94% 95%    Last Pain:  Vitals:   03/08/22 1430  TempSrc:   PainSc: 3                  Barnet Glasgow

## 2022-03-08 NOTE — Interval H&P Note (Signed)
History and Physical Interval Note:  03/08/2022 11:21 AM  Phyllis Andrews  has presented today for surgery, with the diagnosis of Ruptured silicone breast implant, initial encounter.  The various methods of treatment have been discussed with the patient and family. After consideration of risks, benefits and other options for treatment, the patient has consented to  Procedure(s) with comments: REMOVAL BREAST IMPLANTS (Bilateral) - 2.0 hours per surgeon as a surgical intervention.  The patient's history has been reviewed, patient examined, no change in status, stable for surgery.  I have reviewed the patient's chart and labs.  Questions were answered to the patient's satisfaction.     Loel Lofty Gracin Mcpartland

## 2022-03-08 NOTE — Op Note (Signed)
DATE OF OPERATION: 03/08/2022  LOCATION: [facility]  PREOPERATIVE DIAGNOSIS: Ruptured breast implant  POSTOPERATIVE DIAGNOSIS: Same  PROCEDURE:  Removal of right ruptured breast implant Removal of left breast implant  SURGEON: Lyndee Leo Sanger Jeiry Birnbaum, DO  ASSISTANT: Donnamarie Rossetti, PA  EBL: 5 cc  CONDITION: Stable  COMPLICATIONS: None  INDICATION: The patient, Phyllis Andrews, is a 71 y.o. female born on [birth year], is here for treatment of implants that have been in place for many years.  The right implant is believed to be ruptured. Patient decided for removal without replacement or mastopexy at this time.  PROCEDURE DETAILS:  The patient was seen prior to surgery and marked.  The IV antibiotics were given. The patient was taken to the operating room and given a general anesthetic. A standard time out was performed and all information was confirmed by those in the room. SCDs were placed.   The chest was prepped and draped.  Local with epinephrine was injected at the inframammary fold on both sides.    Right:  The #15 blade was used to make a 3 cm incision at the inframammary fold.  The bovie was used to dissect to the capsule and implant.  The capsule was released from the surrounding tissue.  There were a few areas that had hard silicone outside the capsule but not at the lateral aspect where the redness was located on the skin.  The entire capsule and ruptured implant were removed.  The pocket was irrigated and hemostasis was achieved.  The #15 drain was placed and secured to the skin with the 4-0 Silk. The deep layers were closed with the 3-0 PDS followed by the 3-0 Monocryl and 4-0 Monocryl.  Left:  The #15 blade was used to make a 3 cm incision at the inframammary fold.  The bovie was used to dissect to the capsule and implant.  The capsule was released from the surrounding tissue.  The entire capsule and McGhan 270 cc textured implant were removed.  The pocket was  irrigated and hemostasis was achieved.  The #15 drain was placed and secured to the skin with the 4-0 Silk. The deep layers were closed with the 3-0 PDS followed by the 3-0 Monocryl and 4-0 Monocryl. The patient was allowed to wake up and taken to recovery room in stable condition at the end of the case. The family was notified at the end of the case.   The advanced practice practitioner (APP) assisted throughout the case.  The APP was essential in retraction and counter traction when needed to make the case progress smoothly.  This retraction and assistance made it possible to see the tissue plans for the procedure.  The assistance was needed for blood control, tissue re-approximation and assisted with closure of the incision site.

## 2022-03-08 NOTE — Anesthesia Procedure Notes (Signed)
Procedure Name: LMA Insertion Date/Time: 03/08/2022 12:01 PM  Performed by: Glory Buff, CRNAPre-anesthesia Checklist: Patient identified, Emergency Drugs available, Suction available and Patient being monitored Patient Re-evaluated:Patient Re-evaluated prior to induction Oxygen Delivery Method: Circle system utilized Preoxygenation: Pre-oxygenation with 100% oxygen Induction Type: IV induction LMA: LMA inserted LMA Size: 4.0 Number of attempts: 1 Placement Confirmation: positive ETCO2 Tube secured with: Tape Dental Injury: Teeth and Oropharynx as per pre-operative assessment

## 2022-03-08 NOTE — Transfer of Care (Signed)
Immediate Anesthesia Transfer of Care Note  Patient: Phyllis Andrews  Procedure(s) Performed: REMOVAL BREAST IMPLANTS (Bilateral: Breast)  Patient Location: PACU  Anesthesia Type:General  Level of Consciousness: drowsy, patient cooperative and responds to stimulation  Airway & Oxygen Therapy: Patient Spontanous Breathing and Patient connected to face mask oxygen  Post-op Assessment: Report given to RN and Post -op Vital signs reviewed and stable  Post vital signs: Reviewed and stable  Last Vitals:  Vitals Value Taken Time  BP    Temp    Pulse 79 03/08/22 1347  Resp 14 03/08/22 1347  SpO2 95 % 03/08/22 1347  Vitals shown include unvalidated device data.  Last Pain:  Vitals:   03/08/22 0940  TempSrc: Oral  PainSc: 0-No pain      Patients Stated Pain Goal: 3 (24/58/09 9833)  Complications: No notable events documented.

## 2022-03-08 NOTE — Discharge Instructions (Addendum)
INSTRUCTIONS FOR AFTER BREAST SURGERY   You will likely have some questions about what to expect following your operation.  The following information will help you and your family understand what to expect when you are discharged from the hospital.  Following these guidelines will help ensure a smooth recovery and reduce risks of complications.  Postoperative instructions include information on: diet, wound care, medications and physical activity.  AFTER SURGERY Expect to go home after the procedure.  In some cases, you may need to spend one night in the hospital for observation.  DIET Breast surgery does not require a specific diet.  However, the healthier you eat the better your body can start healing. It is important to increasing your protein intake.  This means limiting the foods with sugar and carbohydrates.  Focus on vegetables and some meat.  If you have any liposuction during your procedure be sure to drink water.  If your urine is bright yellow, then it is concentrated, and you need to drink more water.  As a general rule after surgery, you should have 8 ounces of water every hour while awake.  If you find you are persistently nauseated or unable to take in liquids let us know.  NO TOBACCO USE or EXPOSURE.  This will slow your healing process and increase the risk of a wound.  WOUND CARE Leave the binder on for 3 days . Use fragrance free soap.   After 3 days you can remove the binder to shower. Once dry apply ACE wrap, binder or sports bra.  Use a mild soap like Dial, Dove and [place]. You may have Topifoam or Lipofoam on.  It is soft and spongy and helps keep you from getting creases if you have liposuction.  This can be removed before the shower and then replaced.  If you need more it is available on [employer] (Lipofoam). If you have steri-strips / tape directly attached to your skin leave them in place. It is OK to get these wet.   No baths, pools or hot tubs for four weeks. We close your  incision to leave the smallest and best-looking scar. No ointment or creams on your incisions until given the go ahead.  Especially not Neosporin (Too many skin reactions with this one).  A few weeks after surgery you can use Mederma and start massaging the scar. We ask you to wear your binder or sports bra for the first 6 weeks around the clock, including while sleeping. This provides added comfort and helps reduce the fluid accumulation at the surgery site.  ACTIVITY No heavy lifting until cleared by the doctor.  This usually means no more than a half-gallon of milk.  It is OK to walk and climb stairs. In fact, moving your legs is very important to decrease your risk of a blood clot.  It will also help keep you from getting deconditioned.  Every 1 to 2 hours get up and walk for 5 minutes. This will help with a quicker recovery back to normal.  Let pain be your guide so you don't do too much.  This is not the time for spring cleaning and don't plan on taking care of anyone else.  This time is for you to recover,  You will be more comfortable if you sleep and rest with your head elevated either with a few pillows under you or in a recliner.  No stomach sleeping for a three months.  WORK Everyone returns to work at different times.  As a rough guide, most people take at least 1 - 2 weeks off prior to returning to work. If you need documentation for your job, bring the forms to your postoperative follow up visit.  DRIVING Arrange for someone to bring you home from the hospital.  You may be able to drive a few days after surgery but not while taking any narcotics or valium.  BOWEL MOVEMENTS Constipation can occur after anesthesia and while taking pain medication.  It is important to stay ahead for your comfort.  We recommend taking Milk of Magnesia (2 tablespoons; twice a day) while taking the pain pills.  MEDICATIONS You may be prescribed should start after surgery At your preoperative visit for you  history and physical you may have been given the following medications: An antibiotic: Start this medication when you get home and take according to the instructions on the bottle. Zofran 4 mg:  This is to treat nausea and vomiting.  You can take this every 6 hours as needed and only if needed. Valium 2 mg: This is for muscle tightness if you have an implant or expander. This will help relax your muscle which also helps with pain control.  This can be taken every 12 hours as needed. Don't drive after taking this medication. Norco (hydrocodone/acetaminophen) 5/325 mg:  This is only to be used after you have taken the motrin or the tylenol. Every 8 hours as needed.   Over the counter Medication to take: Ibuprofen (Motrin) 600 mg:  Take this every 6 hours.  If you have additional pain then take 500 mg of the tylenol every 8 hours.  Only take the Norco after you have tried these two. Miralax or stool softener of choice: Take this according to the bottle if you take the [place] Call your surgeon's office if any of the following occur: Fever 101 degrees F or greater Excessive bleeding or fluid from the incision site. Pain that increases over time without aid from the medications Redness, warmth, or pus draining from incision sites Persistent nausea or inability to take in liquids Severe misshapen area that underwent the operation.   No Tylenol before 8pm if needed.  Post Anesthesia Home Care Instructions  Activity: Get plenty of rest for the remainder of the day. A responsible individual must stay with you for 24 hours following the procedure.  For the next 24 hours, DO NOT: -Drive a car -Paediatric nurse -Drink alcoholic beverages -Take any medication unless instructed by your physician -Make any legal decisions or sign important papers.  Meals: Start with liquid foods such as gelatin or soup. Progress to regular foods as tolerated. Avoid greasy, spicy, heavy foods. If  nausea and/or vomiting occur, drink only clear liquids until the nausea and/or vomiting subsides. Call your physician if vomiting continues.  Special Instructions/Symptoms: Your throat may feel dry or sore from the anesthesia or the breathing tube placed in your throat during surgery. If this causes discomfort, gargle with warm salt water. The discomfort should disappear within 24 hours.  If you had a scopolamine patch placed behind your ear for the management of post- operative nausea and/or vomiting:  1. The medication in the patch is effective for 72 hours, after which it should be removed.  Wrap patch in a tissue and discard in the trash. Wash hands thoroughly with soap and water. 2. You may remove the patch earlier than 72 hours if you experience unpleasant side effects which may include dry mouth,  dizziness or visual disturbances. 3. Avoid touching the patch. Wash your hands with soap and water after contact with the patch.       JP Drain [employer] this sheet to all of your post-operative appointments while you have your drains. Please measure your drains by CC's or ML's. Make sure you drain and measure your JP Drains 2 or 3 times per day. At the end of each day, add up totals for the left side and add up totals for the right side.    ( 9 am )     ( 3 pm )        ( 9 pm )                Date L  R  L  R  L  R  Total L/R                                                                                                                                                                                        About my Jackson-Pratt Bulb Drain  What is a Jackson-Pratt bulb? A Jackson-Pratt is a soft, round device used to collect drainage. It is connected to a long, thin drainage catheter, which is held in place by one or two small stiches near your surgical incision site. When the bulb is squeezed, it forms a vacuum, forcing the drainage to empty into the bulb.  Emptying the  Jackson-Pratt bulb- To empty the bulb: 1. Release the plug on the top of the bulb. 2. Pour the bulb's contents into a measuring container which your nurse will provide. 3. Record the time emptied and amount of drainage. Empty the drain(s) as often as your     doctor or nurse recommends.  Date                  Time                    Amount (Drain 1)                 Amount (Drain 2)  _____________________________________________________________________  _____________________________________________________________________  _____________________________________________________________________  _____________________________________________________________________  _____________________________________________________________________  _____________________________________________________________________  _____________________________________________________________________  _____________________________________________________________________  Squeezing the Jackson-Pratt Bulb- To squeeze the bulb: 1. Make sure the plug at the top of the bulb is open. 2. Squeeze the bulb tightly in your fist. You will hear air squeezing from the bulb. 3. Replace the plug while the bulb is squeezed. 4. Use a safety pin to attach the bulb to your clothing. This will keep the catheter from     pulling at the bulb insertion  site.  When to call your doctor- Call your doctor if: Drain site becomes red, swollen or hot. You have a fever greater than 101 degrees F. There is oozing at the drain site. Drain falls out (apply a guaze bandage over the drain hole and secure it with tape). Drainage increases daily not related to activity patterns. (You will usually have more drainage when you are active than when you are resting.) Drainage has a bad odor.

## 2022-03-09 ENCOUNTER — Encounter (HOSPITAL_BASED_OUTPATIENT_CLINIC_OR_DEPARTMENT_OTHER): Payer: Self-pay | Admitting: Plastic Surgery

## 2022-03-10 LAB — SURGICAL PATHOLOGY

## 2022-03-14 ENCOUNTER — Encounter: Payer: Medicare PPO | Admitting: Physical Therapy

## 2022-03-16 ENCOUNTER — Encounter: Payer: Self-pay | Admitting: Surgical

## 2022-03-16 ENCOUNTER — Ambulatory Visit (INDEPENDENT_AMBULATORY_CARE_PROVIDER_SITE_OTHER): Payer: Medicare PPO | Admitting: Surgical

## 2022-03-16 DIAGNOSIS — T8543XA Leakage of breast prosthesis and implant, initial encounter: Secondary | ICD-10-CM

## 2022-03-16 NOTE — Progress Notes (Signed)
Patient is a 71 year old female here for follow-up after removal of bilateral breast implants with Dr. Marla Roe on 03/08/2022.  Patient had bilateral 15 drains placed.  Patient reports she is overall doing well, has some questions about her right breast.  She previously had cellulitis in this area and has some questions about if it was associated with the silicone migration.  Surgical pathology showed fibrous capsules bilaterally, negative for malignancy. Patient reports JP drain output has been consistently less than 20 cc per 24 hours.  She is not having any infectious symptoms.  She has some questions about seromas, reports she read about this online.  Chaperone present on exam On exam bilateral NAC's are viable, bilateral JP drains are in place with serosanguineous drainage noted.  There is no erythema or cellulitic changes noted.  Honeycomb dressings are in place, on soiled.  Bilateral JP drains were removed due to low output, recommend continue with compressive garments to decrease risk of seroma formation.  We discussed what a seroma was and answered her questions associated with this.  I discussed with the patient that I was not in her surgical procedure therefore I did not feel comfortable giving her specifications about her questions related to the silicone migration as I was not aware of the specifics.  She is going to see another provider who was in her surgery and we will schedule her an appointment with Dr. Marla Roe in a few weeks to discuss as well.  I do see any signs of infection on exam.  All of her questions were answered to her content.

## 2022-03-17 ENCOUNTER — Encounter: Payer: Medicare PPO | Admitting: Plastic Surgery

## 2022-03-21 ENCOUNTER — Ambulatory Visit (INDEPENDENT_AMBULATORY_CARE_PROVIDER_SITE_OTHER): Payer: Medicare PPO | Admitting: Student

## 2022-03-21 ENCOUNTER — Encounter: Payer: Medicare PPO | Admitting: Physical Therapy

## 2022-03-21 DIAGNOSIS — T8543XD Leakage of breast prosthesis and implant, subsequent encounter: Secondary | ICD-10-CM

## 2022-03-21 NOTE — Progress Notes (Signed)
Patient is a 71 year old female who underwent bilateral breast implant removal with Dr. Marla Roe on 03/08/2022.  The right breast implant was found to be ruptured intraoperatively.  Patient presents today to the clinic for postoperative follow-up.  Patient was last seen in the clinic on 03/16/2022.  At this visit, patient overall reported she was doing well.  Surgical pathology showed fibrous capsules bilaterally, negative for malignancy.  Her bilateral JP drains were removed.  Today, patient reports she is doing well.  She states that she sometimes has intermittent sharp pains to her breast bilaterally.  She states these are short-lived.  Patient also reports she has some redness to the lateral aspect of her breast similar to what she has been experiencing for a while.  Patient denies any fevers or chills.  She denies any issues with the incision sites.  Chaperone present on exam.  On exam, patient is sitting upright in no acute distress.  Breasts are soft and symmetric bilaterally.  Incisions are intact bilaterally.  Drain sites appear to be healing well.  There is some mild erythema/irritation noted to the right lateral aspect of the patient's breast. There are no significant fluid collections palpated on exam to either breast.  There is no drainage from either incision sites or drain sites.  I discussed with the patient to continue to wear her compressive garments and continue activity restrictions.  Instructed patient to put a small amount of Vaseline daily over her drain sites and her incision sites.  I discussed with the patient that she could put a small amount of hydrocortisone cream over the area of redness.  I discussed with the patient that if this area becomes more red, swollen, or if she develops fevers or chills, that she should reach out to [place].  Patient expressed understanding.  Patient had a few questions regarding her surgery.  All of her questions were answered to her satisfaction.   Patient inquired about further imaging after surgery.  I discussed with the patient that she could potentially get a mammogram or ultrasound in 3 months if there is any concern.   Pictures were obtained of the patient placed in patient's chart with the patient's permission.  Instructed the patient to call if she has any questions or concerns.  Objective findings and plan were discussed with Dr. Marla Roe.  Patient to follow-up in 2 weeks with Dr. Marla Roe.

## 2022-04-04 ENCOUNTER — Encounter: Payer: Medicare PPO | Admitting: Physical Therapy

## 2022-04-07 ENCOUNTER — Ambulatory Visit (INDEPENDENT_AMBULATORY_CARE_PROVIDER_SITE_OTHER): Payer: Medicare PPO | Admitting: Plastic Surgery

## 2022-04-07 DIAGNOSIS — T8543XD Leakage of breast prosthesis and implant, subsequent encounter: Secondary | ICD-10-CM

## 2022-04-07 NOTE — Progress Notes (Signed)
   Subjective:    Patient ID: Phyllis Andrews, female    DOB: [birth year], 71 y.o.   MRN: [id number]  The patient is a 71 year old female here for follow-up after having had her implants removed.  They were placed in 1987.  She had a rupture of the right implant.  We removed both implants February 26, 2017 2023.  She does not have any sign of a hematoma or seroma.  The right breast still has the appearance of bruising.  It does not look like cellulitis or infection.  It looks like irritation from the silicone with some firmness in the area.     Review of Systems  Constitutional: Negative.   Eyes: Negative.   Respiratory: Negative.    Cardiovascular: Negative.   Endocrine: Negative.   Genitourinary: Negative.        Objective:   Physical Exam Constitutional:      Appearance: Normal appearance.  Cardiovascular:     Rate and Rhythm: Normal rate.     Pulses: Normal pulses.  Pulmonary:     Effort: Pulmonary effort is normal.  Neurological:     Mental Status: She is alert and oriented to person, place, and time.  Psychiatric:        Mood and Affect: Mood normal.        Behavior: Behavior normal.          Assessment & Plan:     ICD-10-CM   1. Ruptured silicone breast implant, subsequent encounter  T85.43XD        We discussed the concern about the skin discoloration and talked about doing a biopsy of the skin if there is no improvement by her next visit.  We can also do an ultrasound if needed.  She can go into a sports bra.  She knows to call with any questions or concerns if she needs to see [place] sooner.  Pictures were obtained of the patient and placed in the chart with the patient's or guardian's permission.

## 2022-04-12 ENCOUNTER — Ambulatory Visit: Payer: Medicare PPO | Attending: Internal Medicine | Admitting: Physical Therapy

## 2022-04-12 DIAGNOSIS — R2689 Other abnormalities of gait and mobility: Secondary | ICD-10-CM | POA: Insufficient documentation

## 2022-04-12 DIAGNOSIS — R293 Abnormal posture: Secondary | ICD-10-CM | POA: Insufficient documentation

## 2022-04-12 DIAGNOSIS — M533 Sacrococcygeal disorders, not elsewhere classified: Secondary | ICD-10-CM | POA: Insufficient documentation

## 2022-04-12 NOTE — Therapy (Unsigned)
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   Past Surgical History:  Procedure Laterality Date   BRAIN SURGERY  2018   crainiotomy for meningioma   BREAST IMPLANT REMOVAL Bilateral 03/08/2022   Procedure: REMOVAL BREAST IMPLANTS;  Surgeon: Wallace Going, DO;  Location: [facility];  Service: Plastics;  Laterality: Bilateral;  2.0 hours per surgeon   CATARACT EXTRACTION     COLONOSCOPY WITH PROPOFOL N/A 06/06/2021   Procedure: COLONOSCOPY WITH PROPOFOL;  Surgeon: Lesly Rubenstein, MD;  Location: [facility];  Service: Endoscopy;  Laterality: N/A;   PLACEMENT OF BREAST IMPLANTS     TONSILECTOMY, ADENOIDECTOMY, BILATERAL MYRINGOTOMY AND TUBES     Patient Active Problem List   Diagnosis Date Noted   Ruptured silicone breast implant 10/21/2021    PCP: Tressia Miners   REFERRING PROVIDER:  Nadara Mode   REFERRING DIAG: N81.89 (ICD-10-CM) - Other female genital prolapse Rationale for Evaluation and Treatment Rehabilitation  THERAPY DIAG:  Sacrococcygeal disorders, not elsewhere classified - Plan: PT plan of care cert/re-cert  Abnormal posture - Plan: PT plan of care cert/re-cert  Other abnormalities of gait and mobility - Plan: PT plan of care cert/re-cert  ONSET DATE: 10 years ago   SUBJECTIVE:                                                                                                                                                                                            SUBJECTIVE STATEMENT: 1) frequent urination/ urge incontinence/ SUI:  Pt had this issue 10 years ago. Pt went to a private PT clinic and did exercises. It helped some. She quit doing the exercises. Frequency within 2 hours:  0-2 x.  Pt sometimes feels she has not completely emptied. Pt thinks when she is stressed or anxious, frequency is increased. Sometimes, she is triggered to go to pee when she sees a bathroom. Pt  has leaked before making it to the bathroom in the past. Pt changes her urinary pads 2 x day. Nocturia: 1 x night.   Daily fluid intake: 64 fl oz of water, no tea, coffee, coffee. Pt does not like to do leg lift because it makes her feel like she has to pee.   Pt has leakage with sneezing   2) straining with bowel movements 60-70% of the time: Pt take Metamucil. Pt has type 4 stool consistency.    PERTINENT HISTORY:            2018 meningiomona was removed and it had affected her   vocal cords and swallow. Pt had a feeding tube tor 5-6 months.  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 Pt recently had breast implant removed    Denied LBP, fall onto tailbone. Have not had any children and denied abdominal surgeries. Pt did do sit -up and crunch on her own and at the gym and personal trainer. Pt plans to return to the trainer next week and will work with her 1 x week. Pt works out in [employer] 1 x on her own and attends yoga classes. Pt would like to attend a Pilates.    PAIN:  Are you having pain? No    PRECAUTIONS: None  WEIGHT BEARING RESTRICTIONS No  FALLS:  Has patient fallen in last 6 months? No  LIVING ENVIRONMENT: Lives with: lives with their spouse Lives in: House/apartment Stairs: Yes, one flight of stairs inside, back door steps 7-8 with rail    OCCUPATION: retired , Data processing manager work, hobbies, read, exercise some, care new puppy   PLOF: Independent  PATIENT  GOALS   Not feel like she has to pee a lot    OBJECTIVE:    [facility] PT Assessment - 04/12/22 1402       Strength   Overall Strength Comments R hip flex/ knee flexion 4-/5, L 5/5, B hip abd 3/5      Palpation   SI assessment  R iliac crest lowered    Palpation comment small abdominal scar in the  upper L from feeding tube due to meniningionoma      Bed Mobility   Bed Mobility --   half crunch            Pelvic Floor Special Questions - 04/12/22 1414     Diastasis Recti neg              [facility] Adult PT Treatment/Exercise - 04/12/22 1402       Therapeutic Activites    Therapeutic Activities Other Therapeutic Activities    Other Therapeutic Activities see pt education, approach to Pelvic PT , answered pt's questions      Neuro Re-ed    Neuro Re-ed Details  cued for body mechanics and explained to select yoga versus pilates this week                 HOME EXERCISE PROGRAM: See pt instruction section    ASSESSMENT:  CLINICAL IMPRESSION:  Pt is a 71 yo who presents with frequent urination/ urge incontinence/ SUI and straining with bowel movements which impact QOL, ADL, fitness, and community activities.   Pt's musculoskeletal assessment revealed uneven pelvic girdle,  dyscoordination and strength of pelvic floor mm, abdominal scar restrictions,  poor body mechanics which places strain on the abdominal/pelvic floor mm. These are deficits that indicate an ineffective intraabdominal pressure system associated with increased risk for pt's Sx.  Pt will benefit from proper coordination training and education on fitness and functional positions in order to yield greater outcomes as pt performed sit-ups/ crunches in the past. Advised pt to not perform sit-ups and crunches as these movement patterns lead to more downward forces on the pelvic floor, negatively impacting abdominopelvic/spinal dysfunctions and urinary leakage.  Pt was provided education on etiology of Sx  with anatomy, physiology explanation with images along with the benefits of customized pelvic PT Tx based on pt's medical conditions and musculoskeletal deficits.  Explained the physiology of deep core mm coordination and roles of pelvic floor function in urination, defecation, sexual function, and postural control with deep core mm system.    Pt benefits from skilled PT.  Plan to address uneven pelvic girdle height at next session.    OBJECTIVE IMPAIRMENTS decreased activity tolerance, decreased coordination, decreased endurance, decreased mobility, difficulty walking, decreased ROM, decreased strength, decreased safety awareness, hypomobility, increased muscle spasms, impaired flexibility, improper body mechanics, postural dysfunction, and scar restrictions   ACTIVITY LIMITATIONS  self-care,  home chores, work tasks    PARTICIPATION LIMITATIONS:  community, gym activities    PERSONAL FACTORS      Pt's Hx of having a feeding tube with residual scar adhesions over abdomen and Hx of performing sit-up/ crunches are also affecting patient's functional outcome.    REHAB POTENTIAL: Good   CLINICAL DECISION MAKING: Evolving/moderate complexity   EVALUATION COMPLEXITY: Moderate    PATIENT EDUCATION:    Education details: Showed pt anatomy images. Explained muscles attachments/ connection, physiology of deep core system/ spinal- thoracic-pelvis-lower kinetic chain as they relate to pt's presentation, Sx, and past Hx. Explained what and how these areas of deficits need to be restored to balance and function    See Therapeutic activity / neuromuscular re-education section  Answered pt's questions.   Person educated: Patient Education method: Explanation, Demonstration, Tactile cues, Verbal cues, and Handouts Education comprehension: verbalized understanding, returned demonstration, verbal cues required, tactile cues required, and needs further education     PLAN: PT FREQUENCY: 1x/week    PT DURATION: 10 weeks   PLANNED INTERVENTIONS: Therapeutic exercises, Therapeutic activity, Neuromuscular re-education, Balance training, Gait training, Patient/Family education, Self Care, Joint mobilization, Spinal mobilization, Moist heat, Taping, and Manual therapy.   PLAN FOR NEXT SESSION: See clinical impression for plan     GOALS: Goals reviewed with patient? Yes  SHORT TERM GOALS: Target date: 05/11/2022    Pt will demo IND with HEP                    Baseline: Not IND            Goal status: INITIAL   LONG TERM GOALS: Target date: 06/21/2022    1.Pt will demo proper deep core coordination without chest breathing and optimal excursion of diaphragm/pelvic floor in order to promote spinal stability and pelvic floor function  Baseline: dyscoordination Goal status: INITIAL  2.  Pt will demo > 5 pt change on FOTO  to improve QOL and function  Urinary Problem baseline Pelvic Pain baseline Bowel baseline PFDI Urinary baseline  Goal status: INITIAL  3.  Pt will demo proper body mechanics in against gravity tasks and ADLs  work tasks, fitness  to minimize straining pelvic floor / back                  Baseline: not IND, improper form that places strain on pelvic floor  Goal status: INITIAL    4. Pt reports straining with BMs < 25% of the time in order to minimize worsening of pelvic issues  Baseline: straining with bowel movements 60-70% of the time Goal status: INITIAL    5. Pt will utilize urge suppression technique, proper lengthening of pelvic floor / relaxation with coordinated breathing in order to report decreased urge when seeing a bathroom, decreased frequency when feeling anxious, and improved completely emptying of urine.  Baseline:  difficulty with emptying urine completely, increased frequency when she sees a bathroom Goal status: INITIAL      Jerl Mina, PT 04/13/2022, 10:55 AM

## 2022-04-12 NOTE — Patient Instructions (Signed)
  ___ Avoid straining pelvic floor, abdominal muscles , spine  Use log rolling technique instead of getting out of bed with your neck or the sit-up     Log rolling into and out of bed   Log rolling into and out of bed If getting out of bed on R side, Bent knees, scoot hips/ shoulder to L  Raise R arm completely overhead, rolling onto armpit  Then lower bent knees to bed to get into complete side lying position  Then drop legs off bed, and push up onto R elbow/forearm, and use L hand to push onto the bed  __   Proper body mechanics with getting out of a chair to decrease strain  on back &pelvic floor   Avoid holding your breath when Getting out of the chair:  Scoot to front part of chair chair Heels behind knees, feet are hip width apart, nose over toes  Inhale like you are smelling roses Exhale to stand   __  Sitting with feet on ground, four points of contact Catch yourself crossing ankles and thighs  __

## 2022-04-19 ENCOUNTER — Ambulatory Visit: Payer: Medicare PPO | Admitting: Physical Therapy

## 2022-04-19 DIAGNOSIS — R2689 Other abnormalities of gait and mobility: Secondary | ICD-10-CM

## 2022-04-19 DIAGNOSIS — M533 Sacrococcygeal disorders, not elsewhere classified: Secondary | ICD-10-CM | POA: Diagnosis not present

## 2022-04-19 DIAGNOSIS — R293 Abnormal posture: Secondary | ICD-10-CM

## 2022-04-19 NOTE — Patient Instructions (Signed)
Stretches :   Neck / shoulder stretches:    Lying on back - small sushi roll towel under neck  _ 6 directions  5 reps  _elbow circles in and out to lower shoulder blades down and back  10 reps  _angel wings, lower elbows down , keep arms touching bed  10 reps

## 2022-04-19 NOTE — Therapy (Signed)
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   Past Surgical History:  Procedure Laterality Date   BRAIN SURGERY  2018   crainiotomy for meningioma   BREAST IMPLANT REMOVAL Bilateral 03/08/2022   Procedure: REMOVAL BREAST IMPLANTS;  Surgeon: Wallace Going, DO;  Location: [facility];  Service: Plastics;  Laterality: Bilateral;  2.0 hours per surgeon   CATARACT EXTRACTION     COLONOSCOPY WITH PROPOFOL N/A 06/06/2021   Procedure: COLONOSCOPY WITH PROPOFOL;  Surgeon: Lesly Rubenstein, MD;  Location: [facility];  Service: Endoscopy;  Laterality: N/A;   PLACEMENT OF BREAST IMPLANTS     TONSILECTOMY, ADENOIDECTOMY, BILATERAL MYRINGOTOMY AND TUBES     Patient Active Problem List   Diagnosis Date Noted   Ruptured silicone breast implant 10/21/2021    PCP: Tressia Miners   REFERRING PROVIDER:  Nadara Mode   REFERRING DIAG: N81.89 (ICD-10-CM) - Other female genital prolapse Rationale for Evaluation and Treatment Rehabilitation  THERAPY DIAG:  Abnormal posture  Other abnormalities of gait and mobility  Sacrococcygeal disorders, not elsewhere classified  ONSET DATE: 10 years ago   SUBJECTIVE:                                                                                                                                                                                                         SUBJECTIVE TODAY  04/19/22:                          Pt has been practicing not crossing  her feet and sitting up right.    SUBJECTIVE STATEMENT on EVAL 04/12/22 : 1) frequent urination/ urge incontinence/ SUI:  Pt had this issue 10 years ago. Pt went to a private PT clinic and did exercises. It helped some. She quit doing the  exercises. Frequency within 2 hours:  0-2 x.  Pt sometimes feels she has not completely emptied. Pt thinks when she is stressed or anxious, frequency is increased. Sometimes, she is triggered to go to pee when she sees a bathroom. Pt has leaked before making it to the bathroom in the past. Pt changes her urinary pads 2 x day. Nocturia: 1 x night.   Daily fluid intake: 64 fl oz of water, no tea, coffee, coffee. Pt does not like to do leg lift because it makes her feel like she has to pee.   Pt has leakage with sneezing   2) straining with bowel movements 60-70% of the time: Pt take Metamucil. Pt has type 4 stool consistency.    PERTINENT HISTORY:            2018 meningiomona was removed and it had affected her   vocal cords and swallow. Pt had a feeding tube tor 5-6 months.  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 Pt recently had breast implant removed    Denied LBP, fall onto tailbone. Have not had any children and denied abdominal surgeries. Pt did do sit -up and crunch on her own and at the gym and personal trainer. Pt plans to return to the trainer next week and will work with her 1 x week. Pt works out in [employer] 1 x on her own and attends yoga classes. Pt would like to attend a Pilates.    PAIN:  Are you having pain? No    PRECAUTIONS: None  WEIGHT BEARING RESTRICTIONS No  FALLS:  Has patient fallen in last 6 months? No  LIVING ENVIRONMENT: Lives with: lives with their spouse Lives in: House/apartment Stairs: Yes, one flight of stairs inside, back door steps 7-8 with rail    OCCUPATION: retired , Data processing manager work, hobbies, read, exercise some,  care new puppy   PLOF: Independent  PATIENT GOALS   Not feel like she has to pee a lot    OBJECTIVE:   [facility] PT Assessment - 04/19/22 1459       AROM   Overall AROM Comments limited sideflexion, plan to measure,  R shoulder with more limitation with scap retraction      Palpation   SI assessment  R shoulder and iliac crest lowered    Palpation comment restricted scars at occiput from [place] surgery,  B breasts where breast implants were removed. ,tightness at intercostals anteriolateralposteriorT5-12 , hypomobile and tender at L T10-L1 , paraspinals/ medial  scap mm B             [facility] Adult PT Treatment/Exercise - 04/19/22 1503       Neuro Re-ed    Neuro Re-ed Details  cued for stretches at neck and upper spine      Modalities   Modalities Moist Heat      Moist Heat Therapy   Number Minutes Moist Heat 5 Minutes    Moist Heat Location --   lower trunk rotation/ unbilled     Manual Therapy   Manual therapy comments STM/MWM at areas noted in assessment ot promote diaphragragmatic excursion/ cervical mobility              HOME EXERCISE PROGRAM: See pt instruction section    ASSESSMENT:  CLINICAL IMPRESSION:  Pt showed levelled pelvic girdle after Tx today. Pt tolerated manual Tx without complaints. Modified HEP to focus on increasing cervical ROM first and withholding thoracic rotation due to limitations from scar  of meniningioma surgery and breast implant removal surgeries . Plan to address limited cervical sideflexion and lower R shoulder height. Add deep core HEP at next session as pt's posture continues to improve.   Pt benefits from skilled PT.   OBJECTIVE IMPAIRMENTS decreased activity tolerance, decreased coordination, decreased endurance, decreased mobility, difficulty walking, decreased ROM, decreased strength, decreased safety awareness, hypomobility, increased muscle spasms, impaired flexibility, improper body mechanics, postural dysfunction, and  scar restrictions   ACTIVITY LIMITATIONS  self-care,  home chores, work tasks    PARTICIPATION LIMITATIONS:  community, gym activities    PERSONAL FACTORS      Pt's Hx of having a feeding tube with residual scar adhesions over abdomen and Hx of performing sit-up/ crunches are also affecting patient's functional outcome.    REHAB POTENTIAL: Good   CLINICAL DECISION MAKING: Evolving/moderate complexity   EVALUATION COMPLEXITY: Moderate    PATIENT EDUCATION:    Education details: Showed pt anatomy images. Explained muscles attachments/ connection, physiology of deep core system/ spinal- thoracic-pelvis-lower kinetic chain as they relate to pt's presentation, Sx, and past Hx. Explained what and how these areas of deficits need to be restored to balance and function    See Therapeutic activity / neuromuscular re-education section  Answered pt's questions.   Person educated: Patient Education method: Explanation, Demonstration, Tactile cues, Verbal cues, and Handouts Education comprehension: verbalized understanding, returned demonstration, verbal cues required, tactile cues required, and needs further education     PLAN: PT FREQUENCY: 1x/week   PT DURATION: 10 weeks   PLANNED INTERVENTIONS: Therapeutic exercises, Therapeutic activity, Neuromuscular re-education, Balance training, Gait training, Patient/Family education, Self Care, Joint mobilization, Spinal mobilization, Moist heat, Taping, and Manual therapy.   PLAN FOR NEXT SESSION: See clinical impression for plan     GOALS: Goals reviewed with patient? Yes  SHORT TERM GOALS: Target date: 05/11/2022    Pt will demo IND with HEP                    Baseline: Not IND            Goal status: INITIAL   LONG TERM GOALS: Target date: 06/21/2022    1.Pt will demo proper deep core coordination without chest breathing and optimal excursion of diaphragm/pelvic floor in order to promote spinal stability and pelvic floor  function  Baseline: dyscoordination Goal status: INITIAL  2.  Pt will demo > 5 pt change on FOTO  to improve QOL and function  Urinary Problem baseline Pelvic Pain baseline Bowel baseline PFDI Urinary baseline  Goal status: INITIAL  3.  Pt will demo proper body mechanics in against gravity tasks and ADLs  work tasks, fitness  to minimize straining pelvic floor / back                  Baseline: not IND, improper form that places strain on pelvic floor                Goal status: INITIAL    4. Pt reports straining with BMs < 25% of the time in order to minimize worsening of pelvic issues  Baseline: straining with bowel movements 60-70% of the time Goal status: INITIAL    5. Pt will utilize urge suppression technique, proper lengthening of pelvic floor / relaxation with coordinated breathing in order to report decreased urge when seeing a bathroom, decreased frequency when feeling anxious, and improved completely emptying of urine.  Baseline:  difficulty with emptying urine completely, increased frequency  when she sees a bathroom Goal status: INITIAL      Jerl Mina, PT 04/19/2022, 1:38 PM

## 2022-04-26 ENCOUNTER — Ambulatory Visit: Payer: Medicare PPO | Attending: Internal Medicine | Admitting: Physical Therapy

## 2022-04-26 DIAGNOSIS — R2689 Other abnormalities of gait and mobility: Secondary | ICD-10-CM | POA: Diagnosis present

## 2022-04-26 DIAGNOSIS — R293 Abnormal posture: Secondary | ICD-10-CM | POA: Diagnosis present

## 2022-04-26 DIAGNOSIS — M533 Sacrococcygeal disorders, not elsewhere classified: Secondary | ICD-10-CM | POA: Diagnosis present

## 2022-04-26 NOTE — Therapy (Addendum)
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   Past Surgical History:  Procedure Laterality Date   BRAIN SURGERY  2018   crainiotomy for meningioma   BREAST IMPLANT REMOVAL Bilateral 03/08/2022   Procedure: REMOVAL BREAST IMPLANTS;  Surgeon: Wallace Going, DO;  Location: [place];  Service: Plastics;  Laterality: Bilateral;  2.0 hours per surgeon   CATARACT EXTRACTION     COLONOSCOPY WITH PROPOFOL N/A 06/06/2021   Procedure: COLONOSCOPY WITH PROPOFOL;  Surgeon: Lesly Rubenstein, MD;  Location: [facility];  Service: Endoscopy;  Laterality: N/A;   PLACEMENT OF BREAST IMPLANTS     TONSILECTOMY, ADENOIDECTOMY, BILATERAL MYRINGOTOMY AND TUBES     Patient Active Problem List   Diagnosis Date Noted   Ruptured silicone breast implant 10/21/2021    PCP: Tressia Miners   REFERRING PROVIDER:  Nadara Mode   REFERRING DIAG: N81.89 (ICD-10-CM) - Other female genital prolapse Rationale for Evaluation and Treatment Rehabilitation  THERAPY DIAG:  Abnormal posture  Other abnormalities of gait and mobility  Sacrococcygeal disorders, not elsewhere classified  ONSET DATE: 10 years ago   SUBJECTIVE:                                                                                                                                                                                                         SUBJECTIVE TODAY  04/26/22:                          Pt noticed soreness along the L upper low back but it went away.    SUBJECTIVE STATEMENT on EVAL 04/12/22 : 1) frequent urination/ urge incontinence/ SUI:  Pt had this issue 10 years ago. Pt went to a private PT clinic and did exercises. It helped some. She quit doing the  exercises. Frequency within 2 hours:  0-2 x.  Pt sometimes feels she has not completely emptied. Pt thinks when she is stressed or anxious, frequency is increased. Sometimes, she is triggered to go to pee when she sees a bathroom. Pt has leaked before making it to the bathroom in the past. Pt changes her urinary pads 2 x day. Nocturia: 1 x night.   Daily fluid intake: 64 fl oz of water, no tea, coffee, coffee. Pt does not like to do leg lift because it makes her feel like she has to pee.   Pt has leakage with sneezing   2) straining with bowel movements 60-70% of the time: Pt take Metamucil. Pt has type 4 stool consistency.    PERTINENT HISTORY:            2018 meningiomona was removed and it had affected her   vocal cords and swallow. Pt had a feeding tube tor 5-6 months.  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 Pt recently had breast implant removed    Denied LBP, fall onto tailbone. Have not had any children and denied abdominal surgeries. Pt did do sit -up and crunch on her own and at the gym and personal trainer. Pt plans to return to the trainer next week and will work with her 1 x week. Pt works out in [employer] 1 x on her own and attends yoga classes. Pt would like to attend a Pilates.    PAIN:  Are you having pain? No    PRECAUTIONS: None  WEIGHT BEARING RESTRICTIONS No  FALLS:  Has patient fallen in last 6 months? No  LIVING ENVIRONMENT: Lives with: lives with their spouse Lives in: House/apartment Stairs: Yes, one flight of stairs inside, back door steps 7-8 with rail    OCCUPATION: retired , Data processing manager work, hobbies, read, exercise some, care  new puppy   PLOF: Independent  PATIENT GOALS   Not feel like she has to pee a lot    OBJECTIVE:   [facility] PT Assessment - 04/26/22 1400       Coordination   Coordination and Movement Description limited anterolateral excursion of R ribs, pertubration of pelvis with deep core level 2, overuse of upper abs in deep core level 1      Palpation   Spinal mobility hypomobile T1-2, interspinals R tightness, intercostal T3-4-5 posterior,              [facility] Adult PT Treatment/Exercise - 04/26/22 1401       Neuro Re-ed    Neuro Re-ed Details  cued for deep core level 1-2, less abodominal straining and more control      Modalities   Modalities Moist Heat      Moist Heat Therapy   Number Minutes Moist Heat 10 Minutes    Moist Heat Location --   throacic ( 5 min unbilled, 5 min billed while provided instruction for deep core)     Manual Therapy   Manual therapy comments STM/MWM at areas noted in assessment ot promote diaphragragmatic excursion               HOME EXERCISE PROGRAM: See pt instruction section    ASSESSMENT:  CLINICAL IMPRESSION:  Pt showed levelled shoulders/ pelvis  today which is good carry over from last session.  Pt tolerated manual Tx without complaints to improve diaphragmatic excursion. Added deep core HEP today  with cues for proper coordination and less ab mm overuse. Plan to address  abdominal scars next session. Provided modifications to her yoga practice to enhance upright posture and optimal diaphragmatic breathing for deep core strengthening which will help with pelvic floor issues.   Pt benefits from skilled PT.   OBJECTIVE IMPAIRMENTS decreased activity tolerance, decreased coordination, decreased endurance, decreased mobility, difficulty walking, decreased ROM, decreased strength, decreased safety awareness, hypomobility, increased muscle spasms, impaired flexibility, improper body mechanics, postural dysfunction, and scar restrictions    ACTIVITY LIMITATIONS  self-care,  home chores, work tasks    PARTICIPATION LIMITATIONS:  community, gym activities    PERSONAL FACTORS      Pt's Hx of having a feeding tube with residual scar adhesions over abdomen and Hx of performing sit-up/ crunches are also affecting patient's functional outcome.    REHAB POTENTIAL: Good   CLINICAL DECISION MAKING: Evolving/moderate complexity   EVALUATION COMPLEXITY: Moderate    PATIENT EDUCATION:    Education details: Showed pt anatomy images. Explained muscles attachments/ connection, physiology of deep core system/ spinal- thoracic-pelvis-lower kinetic chain as they relate to pt's presentation, Sx, and past Hx. Explained what and how these areas of deficits need to be restored to balance and function    See Therapeutic activity / neuromuscular re-education section  Answered pt's questions.   Person educated: Patient Education method: Explanation, Demonstration, Tactile cues, Verbal cues, and Handouts Education comprehension: verbalized understanding, returned demonstration, verbal cues required, tactile cues required, and needs further education     PLAN: PT FREQUENCY: 1x/week   PT DURATION: 10 weeks   PLANNED INTERVENTIONS: Therapeutic exercises, Therapeutic activity, Neuromuscular re-education, Balance training, Gait training, Patient/Family education, Self Care, Joint mobilization, Spinal mobilization, Moist heat, Taping, and Manual therapy.   PLAN FOR NEXT SESSION: See clinical impression for plan     GOALS: Goals reviewed with patient? Yes  SHORT TERM GOALS: Target date: 05/11/2022    Pt will demo IND with HEP                    Baseline: Not IND            Goal status: INITIAL   LONG TERM GOALS: Target date: 06/21/2022    1.Pt will demo proper deep core coordination without chest breathing and optimal excursion of diaphragm/pelvic floor in order to promote spinal stability and pelvic floor function  Baseline:  dyscoordination Goal status: INITIAL  2.  Pt will demo > 5 pt change on FOTO  to improve QOL and function  Urinary Problem baseline Pelvic Pain baseline Bowel baseline PFDI Urinary baseline  Goal status: INITIAL  3.  Pt will demo proper body mechanics in against gravity tasks and ADLs  work tasks, fitness  to minimize straining pelvic floor / back                  Baseline: not IND, improper form that places strain on pelvic floor                Goal status: INITIAL    4. Pt reports straining with BMs < 25% of the time in order to minimize worsening of pelvic issues  Baseline: straining with bowel movements 60-70% of the time Goal status: INITIAL    5. Pt will utilize urge suppression technique, proper lengthening of pelvic floor / relaxation with coordinated breathing in order to report decreased urge when seeing a bathroom, decreased frequency when feeling anxious, and improved completely emptying of urine.  Baseline:  difficulty with emptying urine completely, increased frequency when she sees a  bathroom Goal status: INITIAL      Jerl Mina, PT 04/26/2022, 2:10 PM

## 2022-04-26 NOTE — Patient Instructions (Signed)
Deep core level 1-2   

## 2022-04-28 ENCOUNTER — Ambulatory Visit
Admission: RE | Admit: 2022-04-28 | Discharge: 2022-04-28 | Disposition: A | Discharge status: Home / Self Care | Payer: Medicare PPO | Source: Ambulatory Visit | Attending: Surgery | Admitting: Surgery

## 2022-04-28 DIAGNOSIS — R911 Solitary pulmonary nodule: Secondary | ICD-10-CM | POA: Insufficient documentation

## 2022-04-28 DIAGNOSIS — J9859 Other diseases of mediastinum, not elsewhere classified: Secondary | ICD-10-CM | POA: Insufficient documentation

## 2022-05-03 ENCOUNTER — Encounter: Payer: Self-pay | Admitting: Surgery

## 2022-05-03 ENCOUNTER — Ambulatory Visit: Payer: Medicare PPO | Admitting: Surgery

## 2022-05-03 ENCOUNTER — Other Ambulatory Visit: Payer: Self-pay

## 2022-05-03 ENCOUNTER — Ambulatory Visit: Payer: Medicare PPO | Admitting: Physical Therapy

## 2022-05-03 VITALS — BP 117/78 | HR 76 | Temp 98.7°F | Ht 67.0 in

## 2022-05-03 DIAGNOSIS — R293 Abnormal posture: Secondary | ICD-10-CM

## 2022-05-03 DIAGNOSIS — R911 Solitary pulmonary nodule: Secondary | ICD-10-CM

## 2022-05-03 DIAGNOSIS — R2689 Other abnormalities of gait and mobility: Secondary | ICD-10-CM

## 2022-05-03 DIAGNOSIS — M533 Sacrococcygeal disorders, not elsewhere classified: Secondary | ICD-10-CM

## 2022-05-03 NOTE — Patient Instructions (Addendum)
We will see you back in one year with a CT of the chest to monitor all areas.    See Dr. Marla Roe as scheduled at Plastic Surgery.  Follow-up with our office as needed.  Please call and ask to speak with a nurse if you develop questions or concerns.

## 2022-05-03 NOTE — Patient Instructions (Signed)
Deep core level 1-2 on your back, feet on bed  Not in standing position   Morning, night

## 2022-05-03 NOTE — Therapy (Addendum)
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   Past Surgical History:  Procedure Laterality Date   BRAIN SURGERY  2018   crainiotomy for meningioma   BREAST IMPLANT REMOVAL Bilateral 03/08/2022   Procedure: REMOVAL BREAST IMPLANTS;  Surgeon: Wallace Going, DO;  Location: [place];  Service: Plastics;  Laterality: Bilateral;  2.0 hours per surgeon   CATARACT EXTRACTION     COLONOSCOPY WITH PROPOFOL N/A 06/06/2021   Procedure: COLONOSCOPY WITH PROPOFOL;  Surgeon: Lesly Rubenstein, MD;  Location: [facility];  Service: Endoscopy;  Laterality: N/A;   PLACEMENT OF BREAST IMPLANTS     TONSILECTOMY, ADENOIDECTOMY, BILATERAL MYRINGOTOMY AND TUBES     Patient Active Problem List   Diagnosis Date Noted   Ruptured silicone breast implant 10/21/2021   Mixed hyperlipidemia 12/12/2019   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 07/19/2017   Meningioma ([place]) 07/19/2017   S/P resection of meningioma 07/19/2017   Apneic episode 07/05/2017   Cerebral edema ([place]) 07/04/2017   Dysphagia, neurologic 07/04/2017   Essential hypertension 07/04/2017   GERD (gastroesophageal reflux disease) 06/25/2017   Hematuria 11/09/2015   Mixed incontinence 11/09/2015    PCP: Tressia Miners   REFERRING PROVIDER:  Nadara Mode   REFERRING DIAG: N81.89 (ICD-10-CM) -  Other female genital prolapse Rationale for Evaluation and Treatment Rehabilitation  THERAPY DIAG:  Abnormal posture  Other abnormalities of gait and mobility  Sacrococcygeal disorders, not elsewhere classified  ONSET DATE: 10 years ago   SUBJECTIVE:                                                                                                                                                                                                        SUBJECTIVE TODAY  05/03/22:  Pt noticed she was not as sore like the last time.                           1)  urinary changes:                            2)  bowel movements: pt had noticed decreased straining with bowel movements by 40- 45% . Type 5 stool consistency.     SUBJECTIVE STATEMENT on EVAL 04/12/22 : 1) frequent urination/ urge incontinence/ SUI:  Pt had this issue 10 years ago. Pt went to a private PT clinic and did exercises. It helped some. She quit doing the exercises. Frequency within 2 hours:  0-2 x.  Pt sometimes feels she has not completely emptied. Pt thinks when she is stressed or anxious, frequency is increased. Sometimes, she is triggered to go to pee when she sees a bathroom. Pt has leaked before making it to the bathroom in the past. Pt changes her urinary pads 2 x day. Nocturia: 1 x night.   Daily fluid intake: 64 fl oz of water, no tea, coffee, coffee. Pt does not like to do leg lift because it makes her feel like she has to pee.   Pt has leakage with sneezing   2) straining with bowel movements 60-70% of the time: Pt take Metamucil. Pt has type 4 stool consistency.    PERTINENT HISTORY:            2018 meningiomona was removed and it had affected her   vocal cords and swallow. Pt had a feeding tube tor 5-6 months.  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 Pt recently had breast implant removed    Denied LBP, fall onto tailbone. Have not had any children and denied abdominal surgeries. Pt  did do sit -up and crunch on her own and at the gym and personal trainer. Pt plans to return to the trainer next week and will work with her 1 x week. Pt works out in [employer] 1 x on her own and attends yoga classes. Pt would like to attend a Pilates.    PAIN:  Are you having pain? No    PRECAUTIONS: None  WEIGHT BEARING RESTRICTIONS No  FALLS:  Has patient fallen in last 6 months? No  LIVING ENVIRONMENT: Lives with: lives with their spouse Lives in: House/apartment Stairs: Yes, one flight of stairs inside, back door steps 7-8 with rail    OCCUPATION: retired , Data processing manager work, hobbies, read, exercise some, care new puppy   PLOF: Independent  PATIENT GOALS   Not feel like she has to pee a lot    OBJECTIVE:   [facility] PT Assessment - 05/03/22 1411       Palpation   Spinal mobility tightness along T 09-28-10 R, posterior mm attachments, suprascapula, medial scapula , intercostals , flinching pain at medial scapula R mm ( decreased post Tx)              [facility] PT Assessment - 05/03/22 1411       Palpation   Spinal mobility tightness along T 09-28-10 R, posterior mm attachments, suprascapula, medial scapula , intercostals , flinching pain at medial scapula R mm ( decreased post Tx)             [facility] Adult PT Treatment/Exercise - 05/03/22 1410       Neuro  Re-ed    Neuro Re-ed Details  cued for deep core Level 1-2 technique      Modalities   Modalities Moist Heat      Moist Heat Therapy   Number Minutes Moist Heat 10 Minutes    Moist Heat Location --   thoracic     Manual Therapy   Manual therapy comments STM/MWM at areas noted in assessment ot promote diaphragragmatic excursion or R anterior rib , mobilize medial scapula R              [facility] Adult PT Treatment/Exercise - 05/03/22 1410       Neuro Re-ed    Neuro Re-ed Details  cued for deep core Level 1-2 technique      Modalities   Modalities Moist Heat      Moist Heat Therapy   Number Minutes  Moist Heat 10 Minutes    Moist Heat Location --   thoracic, during review of HEP and neuromuscular reedu     Manual Therapy   Manual therapy comments STM/MWM at areas noted in assessment ot promote diaphragragmatic excursion or R anterior rib , mobilize medial scapula R                HOME EXERCISE PROGRAM: See pt instruction section    ASSESSMENT:  CLINICAL IMPRESSION:  Pt showed levelled shoulders/ pelvis but R shoulder still showed winging. Pt tolerated manual Tx without complaints to improve diaphragmatic excursion and scapular mobility.  Plan to address abdominal scars next session optimize IAP system. Provided education to not perform her deep core HEP in standing position and provided rationale. Pt demo'd proper technique for deep core coordination with less cues. Also discussed sidelying sleeping positions to minimize rounded shoulders.  Pt benefits from skilled PT.   OBJECTIVE IMPAIRMENTS decreased activity tolerance, decreased coordination, decreased endurance, decreased mobility, difficulty walking, decreased ROM, decreased strength, decreased safety awareness, hypomobility, increased muscle spasms, impaired flexibility, improper body mechanics, postural dysfunction, and scar restrictions   ACTIVITY LIMITATIONS  self-care,  home chores, work tasks    PARTICIPATION LIMITATIONS:  community, gym activities    PERSONAL FACTORS      Pt's Hx of having a feeding tube with residual scar adhesions over abdomen and Hx of performing sit-up/ crunches are also affecting patient's functional outcome.    REHAB POTENTIAL: Good   CLINICAL DECISION MAKING: Evolving/moderate complexity   EVALUATION COMPLEXITY: Moderate    PATIENT EDUCATION:    Education details: Showed pt anatomy images. Explained muscles attachments/ connection, physiology of deep core system/ spinal- thoracic-pelvis-lower kinetic chain as they relate to pt's presentation, Sx, and past Hx. Explained what and how  these areas of deficits need to be restored to balance and function    See Therapeutic activity / neuromuscular re-education section  Answered pt's questions.   Person educated: Patient Education method: Explanation, Demonstration, Tactile cues, Verbal cues, and Handouts Education comprehension: verbalized understanding, returned demonstration, verbal cues required, tactile cues required, and needs further education     PLAN: PT FREQUENCY: 1x/week   PT DURATION: 10 weeks   PLANNED INTERVENTIONS: Therapeutic exercises, Therapeutic activity, Neuromuscular re-education, Balance training, Gait training, Patient/Family education, Self Care, Joint mobilization, Spinal mobilization, Moist heat, Taping, and Manual therapy.   PLAN FOR NEXT SESSION: See clinical impression for plan     GOALS: Goals reviewed with patient? Yes  SHORT TERM GOALS: Target date: 05/11/2022    Pt will demo IND with HEP  Baseline: Not IND            Goal status: INITIAL   LONG TERM GOALS: Target date: 06/21/2022    1.Pt will demo proper deep core coordination without chest breathing and optimal excursion of diaphragm/pelvic floor in order to promote spinal stability and pelvic floor function  Baseline: dyscoordination Goal status: INITIAL  2.  Pt will demo > 5 pt change on FOTO  to improve QOL and function  Urinary Problem baseline Pelvic Pain baseline Bowel baseline PFDI Urinary baseline  Goal status: INITIAL  3.  Pt will demo proper body mechanics in against gravity tasks and ADLs  work tasks, fitness  to minimize straining pelvic floor / back                  Baseline: not IND, improper form that places strain on pelvic floor                Goal status: INITIAL    4. Pt reports straining with BMs < 25% of the time in order to minimize worsening of pelvic issues  Baseline: straining with bowel movements 60-70% of the time Goal status: INITIAL    5. Pt will utilize urge  suppression technique, proper lengthening of pelvic floor / relaxation with coordinated breathing in order to report decreased urge when seeing a bathroom, decreased frequency when feeling anxious, and improved completely emptying of urine.  Baseline:  difficulty with emptying urine completely, increased frequency when she sees a bathroom Goal status: INITIAL      Jerl Mina, PT 05/03/2022, 2:20 PM

## 2022-05-07 NOTE — Progress Notes (Signed)
Surgical Consultation  05/07/2022  Phyllis Andrews is an 71 y.o. female.   Chief Complaint  Patient presents with   Follow-up    Mediastinal mass     HPI: Phyllis Andrews is a 71 year old female patient of Dr. Faith Rogue that was seen for right pulmonary nodule on the right lower lobe. She elected to have surveillance and had a recent CT of the chest that have personally reviewed showing a stable 2.4 cm nodule right lower lobe She does have  an unchanged nodule that measures 5 mm. There is some skin changes related to recent excision of implants. She denies any fevers or chills or hemoptysis.  She does endorse some cough. She also had rupture of breast implants and I did a referral to Dr. Duffy Bruce him.  She underwent removal of breast implants couple of months ago.  She does have some thickening on the skin on the chest wall.  She does have some thickening on the skin of the breast and chest wall.  In the right side and endorses some minimal discomfort and pain that is mild and dull.     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    Past Surgical History:  Procedure Laterality Date   BRAIN SURGERY  2018   crainiotomy for meningioma   BREAST IMPLANT REMOVAL Bilateral 03/08/2022   Procedure: REMOVAL BREAST IMPLANTS;  Surgeon: Wallace Going, DO;  Location: [place];  Service: Plastics;  Laterality: Bilateral;  2.0 hours per surgeon   CATARACT EXTRACTION     COLONOSCOPY WITH PROPOFOL N/A 06/06/2021   Procedure: COLONOSCOPY WITH PROPOFOL;  Surgeon: Lesly Rubenstein, MD;  Location: [facility];  Service: Endoscopy;  Laterality: N/A;   PLACEMENT OF BREAST IMPLANTS     TONSILECTOMY, ADENOIDECTOMY, BILATERAL MYRINGOTOMY AND TUBES      Family History  Problem Relation Age of Onset   Dementia Mother    Dementia  Father    Prostate cancer Brother     Social History:  reports that she has never smoked. She has never used smokeless tobacco. She reports current alcohol use. She reports that she does not use drugs.  Allergies:  Allergies  Allergen Reactions   Methylprednisolone Acetate Other (See Comments)    Severe facial swelling    Medications reviewed.     ROS Full ROS performed and is otherwise negative other than what is stated in the HPI    BP 117/78   Pulse 76   Temp 98.7 F (37.1 C) (Oral)   Ht '5\' 7"'$  (1.702 m)   SpO2 98%   BMI 25.90 kg/m   Physical Exam Physical Exam Vitals and nursing note reviewed. Exam conducted with a chaperone present.  Constitutional:      General: She is not in acute distress.    Appearance: Normal appearance.  Cardiovascular:     Rate and Rhythm: Normal rate and regular rhythm.  Pulmonary:     Effort: Pulmonary effort is normal. No respiratory distress.     Breath sounds: Normal breath sounds. No stridor. No rhonchi.     CHEST WALL: There is evidence of some thickening subcutaneous tissue with some mild erythema on the breast and chest wall.  This is likely postsurgical but is difficult to say whether this is more of a granulomatous reaction from the  rupture of the implant Abdominal:     General: Abdomen is flat. There is no distension.     Palpations: Abdomen is soft. There is no mass.     Tenderness: There is no abdominal tenderness.     Hernia: No hernia is present.  Musculoskeletal:        General: Normal range of motion.     Cervical back: Normal range of motion and neck supple. No rigidity or tenderness.  Lymphadenopathy:     Cervical: No cervical adenopathy.  Skin:    General: Skin is warm and dry.     Capillary Refill: Capillary refill takes less than 2 seconds.  Neurological:     General: No focal deficit present.     Mental Status: She is alert and oriented to person, place, and time.  Psychiatric:        Mood and Affect:  Mood normal.        Behavior: Behavior normal.        Thought Content: Thought content normal.        Judgment: Judgment normal.     Assessment/Plan: 71 year old female with a stable right pulmonary nodules.  No need for surgical intervention we will continue surveillance in 1 year.  We had a discussion regarding changes of the skin and there is a postsurgical versus some chronic granulomatous reaction from foreign body related to rupture of the implants.  She will follow-up with plastic surgery for further clarification. Please note I spent 40 minutes in this encounter including personally reviewing imaging studies, coordinating her care, placing orders, counseling the patient and performing appropriate documentation  Caroleen Hamman, MD [facility]

## 2022-05-10 ENCOUNTER — Ambulatory Visit: Payer: Medicare PPO | Admitting: Physical Therapy

## 2022-05-10 DIAGNOSIS — R2689 Other abnormalities of gait and mobility: Secondary | ICD-10-CM

## 2022-05-10 DIAGNOSIS — R293 Abnormal posture: Secondary | ICD-10-CM

## 2022-05-10 DIAGNOSIS — M533 Sacrococcygeal disorders, not elsewhere classified: Secondary | ICD-10-CM

## 2022-05-10 NOTE — Therapy (Signed)
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   Past Surgical History:  Procedure Laterality Date   BRAIN SURGERY  2018   crainiotomy for meningioma   BREAST IMPLANT REMOVAL Bilateral 03/08/2022   Procedure: REMOVAL BREAST IMPLANTS;  Surgeon: Wallace Going, DO;  Location: [place];  Service: Plastics;  Laterality: Bilateral;  2.0 hours per surgeon   CATARACT EXTRACTION     COLONOSCOPY WITH PROPOFOL N/A 06/06/2021   Procedure: COLONOSCOPY WITH PROPOFOL;  Surgeon: Lesly Rubenstein, MD;  Location: [facility];  Service: Endoscopy;  Laterality: N/A;   PLACEMENT OF BREAST IMPLANTS     TONSILECTOMY, ADENOIDECTOMY, BILATERAL MYRINGOTOMY AND TUBES     Patient Active Problem List   Diagnosis Date Noted   Ruptured silicone breast implant 10/21/2021   Mixed hyperlipidemia 12/12/2019   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 07/19/2017   Meningioma ([place]) 07/19/2017   S/P resection of meningioma 07/19/2017   Apneic episode 07/05/2017   Cerebral edema ([place]) 07/04/2017   Dysphagia, neurologic 07/04/2017   Essential hypertension 07/04/2017   GERD (gastroesophageal reflux disease) 06/25/2017   Hematuria 11/09/2015   Mixed incontinence 11/09/2015    PCP: Tressia Miners   REFERRING PROVIDER:  Nadara Mode   REFERRING DIAG: N81.89 (ICD-10-CM) -  Other female genital prolapse Rationale for Evaluation and Treatment Rehabilitation  THERAPY DIAG:  Abnormal posture  Other abnormalities of gait and mobility  Sacrococcygeal disorders, not elsewhere classified  ONSET DATE: 10 years ago   SUBJECTIVE:                                                                                                                                                                                                        SUBJECTIVE TODAY 05/10/22  1)  urinary changes:  No changes yet . Pt is not drinking quite as much water                           2)  bowel movements:  Pt noticed Metamucil helps with bowel movements. Pt feel she need more fiber.     SUBJECTIVE STATEMENT on EVAL 04/12/22 : 1) frequent urination/ urge incontinence/ SUI:  Pt had this issue 10 years ago. Pt went to a private PT clinic and did exercises. It helped some. She quit doing the exercises. Frequency within 2 hours:  0-2 x.  Pt sometimes feels she has not completely emptied. Pt thinks when she is stressed or anxious, frequency is increased. Sometimes, she is triggered to go to pee when she sees a bathroom. Pt has leaked before making it to the bathroom in the past. Pt changes her urinary pads 2 x day. Nocturia: 1 x night.   Daily fluid intake: 64 fl oz of water, no tea, coffee, coffee. Pt does not like to do leg lift because it makes her feel like she has to pee.   Pt has leakage with sneezing   2) straining with bowel movements 60-70% of the time: Pt take Metamucil. Pt has type 4 stool consistency.    PERTINENT HISTORY:            2018 meningiomona was removed and it had affected her   vocal cords and swallow. Pt had a feeding tube tor 5-6 months.  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 Pt recently had breast implant removed    Denied LBP, fall onto tailbone. Have not had any children and denied abdominal surgeries. Pt did do sit  -up and crunch on her own and at the gym and personal trainer. Pt plans to return to the trainer next week and will work with her 1 x week. Pt works out in [employer] 1 x on her own and attends yoga classes. Pt would like to attend a Pilates.    PAIN:  Are you having pain? No    PRECAUTIONS: None  WEIGHT BEARING RESTRICTIONS No  FALLS:  Has patient fallen in last 6 months? No  LIVING ENVIRONMENT: Lives with: lives with their spouse Lives in: House/apartment Stairs: Yes, one flight of stairs inside, back door steps 7-8 with rail    OCCUPATION: retired , Data processing manager work, hobbies, read, exercise some, care new puppy   PLOF: Independent  PATIENT GOALS   Not feel like she has to pee a lot    OBJECTIVE:       [facility] PT Assessment - 05/10/22 1340       Coordination   Coordination and Movement Description ab overuse with deep core level 1, perturbation level 2      Palpation   Spinal mobility tightness along T7-10 intercostals, paraspinals. interspinals, medial/ suprascapula mm attachments    SI assessment  iliac rest levelled, R shoulder slightly lowered.,             [facility] Adult PT Treatment/Exercise - 05/10/22 1409       Neuro Re-ed    Neuro Re-ed Details  cued for deep core level 1-2, breathing,      Modalities   Modalities Moist Heat      Moist Heat Therapy   Number Minutes Moist Heat 10 Minutes    Moist Heat Location --   thoracic, during instruciton of deep core level  1-2     Manual Therapy   Manual therapy comments STM/MWM at areas noted in assessment ot promote diaphragragmatic excursion or R anterior rib , mobilize medial scapula L               HOME EXERCISE PROGRAM: See pt instruction section    ASSESSMENT:  CLINICAL IMPRESSION:  Pt showed levelled pelvis but L scapular region required manual Tx to achieve diaphragmatic excursion. Pt progressed to deep core level 1-2 with cues for proper coordination and less ab overuse. At next  session, Plan to add clam shells to R hip due to weakness.  Pt benefits from skilled PT.   OBJECTIVE IMPAIRMENTS decreased activity tolerance, decreased coordination, decreased endurance, decreased mobility, difficulty walking, decreased ROM, decreased strength, decreased safety awareness, hypomobility, increased muscle spasms, impaired flexibility, improper body mechanics, postural dysfunction, and scar restrictions   ACTIVITY LIMITATIONS  self-care,  home chores, work tasks    PARTICIPATION LIMITATIONS:  community, gym activities    PERSONAL FACTORS      Pt's Hx of having a feeding tube with residual scar adhesions over abdomen and Hx of performing sit-up/ crunches are also affecting patient's functional outcome.    REHAB POTENTIAL: Good   CLINICAL DECISION MAKING: Evolving/moderate complexity   EVALUATION COMPLEXITY: Moderate    PATIENT EDUCATION:    Education details: Showed pt anatomy images. Explained muscles attachments/ connection, physiology of deep core system/ spinal- thoracic-pelvis-lower kinetic chain as they relate to pt's presentation, Sx, and past Hx. Explained what and how these areas of deficits need to be restored to balance and function    See Therapeutic activity / neuromuscular re-education section  Answered pt's questions.   Person educated: Patient Education method: Explanation, Demonstration, Tactile cues, Verbal cues, and Handouts Education comprehension: verbalized understanding, returned demonstration, verbal cues required, tactile cues required, and needs further education     PLAN: PT FREQUENCY: 1x/week   PT DURATION: 10 weeks   PLANNED INTERVENTIONS: Therapeutic exercises, Therapeutic activity, Neuromuscular re-education, Balance training, Gait training, Patient/Family education, Self Care, Joint mobilization, Spinal mobilization, Moist heat, Taping, and Manual therapy.   PLAN FOR NEXT SESSION: See clinical impression for plan      GOALS: Goals reviewed with patient? Yes  SHORT TERM GOALS: Target date: 05/11/2022    Pt will demo IND with HEP                    Baseline: Not IND            Goal status: INITIAL   LONG TERM GOALS: Target date: 06/21/2022    1.Pt will demo proper deep core coordination without chest breathing and optimal excursion of diaphragm/pelvic floor in order to promote spinal stability and pelvic floor function  Baseline: dyscoordination Goal status: INITIAL  2.  Pt will demo > 5 pt change on FOTO  to improve QOL and function  Urinary Problem baseline Pelvic Pain baseline Bowel baseline PFDI Urinary baseline  Goal status: INITIAL  3.  Pt will demo proper body mechanics in against gravity tasks and ADLs  work tasks, fitness  to minimize straining pelvic floor / back                  Baseline: not IND, improper form that places strain on pelvic floor                Goal status: INITIAL    4. Pt reports straining with BMs < 25% of the time in  order to minimize worsening of pelvic issues  Baseline: straining with bowel movements 60-70% of the time Goal status: INITIAL    5. Pt will utilize urge suppression technique, proper lengthening of pelvic floor / relaxation with coordinated breathing in order to report decreased urge when seeing a bathroom, decreased frequency when feeling anxious, and improved completely emptying of urine.  Baseline:  difficulty with emptying urine completely, increased frequency when she sees a bathroom Goal status: INITIAL      Jerl Mina, PT 05/10/2022, 2:11 PM

## 2022-05-10 NOTE — Patient Instructions (Signed)
°  Deep core level 1-2 ( handout) ° ° ° ° °

## 2022-05-17 ENCOUNTER — Ambulatory Visit: Payer: Medicare PPO | Admitting: Physical Therapy

## 2022-05-17 DIAGNOSIS — M533 Sacrococcygeal disorders, not elsewhere classified: Secondary | ICD-10-CM

## 2022-05-17 DIAGNOSIS — R293 Abnormal posture: Secondary | ICD-10-CM | POA: Diagnosis not present

## 2022-05-17 NOTE — Therapy (Signed)
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   Past Surgical History:  Procedure Laterality Date   BRAIN SURGERY  2018   crainiotomy for meningioma   BREAST IMPLANT REMOVAL Bilateral 03/08/2022   Procedure: REMOVAL BREAST IMPLANTS;  Surgeon: Wallace Going, DO;  Location: [place];  Service: Plastics;  Laterality: Bilateral;  2.0 hours per surgeon   CATARACT EXTRACTION     COLONOSCOPY WITH PROPOFOL N/A 06/06/2021   Procedure: COLONOSCOPY WITH PROPOFOL;  Surgeon: Lesly Rubenstein, MD;  Location: [facility];  Service: Endoscopy;  Laterality: N/A;   PLACEMENT OF BREAST IMPLANTS     TONSILECTOMY, ADENOIDECTOMY, BILATERAL MYRINGOTOMY AND TUBES     Patient Active Problem List   Diagnosis Date Noted   Ruptured silicone breast implant 10/21/2021   Mixed hyperlipidemia 12/12/2019   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 07/19/2017   Meningioma ([place]) 07/19/2017   S/P resection of meningioma 07/19/2017   Apneic episode 07/05/2017   Cerebral edema ([place]) 07/04/2017   Dysphagia, neurologic 07/04/2017   Essential hypertension 07/04/2017   GERD (gastroesophageal reflux disease) 06/25/2017   Hematuria 11/09/2015   Mixed incontinence 11/09/2015    PCP: Tressia Miners   REFERRING PROVIDER:  Nadara Mode   REFERRING DIAG: N81.89 (ICD-10-CM) - Other female genital prolapse Rationale for Evaluation and  Treatment Rehabilitation  THERAPY DIAG:  Sacrococcygeal disorders, not elsewhere classified  ONSET DATE: 10 years ago   SUBJECTIVE:                                                                                                                                                                                                        SUBJECTIVE TODAY 05/17/22  1)  urinary changes:  No changes yet . Pt is  drinking more  water                           2)  bowel movements: same Pt is doing her posture exercises    SUBJECTIVE STATEMENT on EVAL 04/12/22 : 1) frequent urination/ urge incontinence/ SUI:  Pt had this issue 10 years ago. Pt went to a private PT clinic and did exercises. It helped some. She quit doing the exercises. Frequency within 2 hours:  0-2 x.  Pt sometimes feels she has not completely emptied. Pt thinks when she is stressed or anxious, frequency is increased. Sometimes, she is triggered to go to pee when she sees a bathroom. Pt has leaked before making it to the bathroom in the past. Pt changes her urinary pads 2 x day. Nocturia: 1 x night.   Daily fluid intake: 64 fl oz of water, no tea, coffee, coffee. Pt does not like to do leg lift because it makes her feel like she has to pee.   Pt has leakage with sneezing   2) straining with bowel movements 60-70% of the time: Pt take Metamucil. Pt has type 4 stool consistency.    PERTINENT HISTORY:            2018 meningiomona was removed and it had affected her   vocal cords and swallow. Pt had a feeding tube tor 5-6 months.  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 Pt recently had breast implant removed    Denied LBP, fall onto tailbone. Have not had any children and denied abdominal surgeries. Pt did do sit -up and crunch on her own and at the gym and personal trainer. Pt plans to return to the trainer next week and will work with her 1 x week. Pt works out in [employer] 1 x on her  own and attends yoga classes. Pt would like to attend a Pilates.    PAIN:  Are you having pain? No    PRECAUTIONS: None  WEIGHT BEARING RESTRICTIONS No  FALLS:  Has patient fallen in last 6 months? No  LIVING ENVIRONMENT: Lives with: lives with their spouse Lives in: House/apartment Stairs: Yes, one flight of stairs inside, back door steps 7-8 with rail    OCCUPATION: retired , Data processing manager work, hobbies, read, exercise some, care new puppy   PLOF: Independent  PATIENT GOALS   Not feel like she has to pee a lot    OBJECTIVE:       [facility] PT Assessment - 05/17/22 1407       Coordination   Coordination and Movement Description poor scapular coordination in hooklying      Palpation   Spinal mobility tightness/ hypomobility at C/T junction, interspinal. intercostals T1-4 , platysma             [facility] Adult PT Treatment/Exercise - 05/17/22 1409       Exercises   Exercises Other Exercises    Other Exercises  see pt instructions      Modalities   Modalities Moist Heat      Moist Heat Therapy   Number Minutes Moist Heat 5 Minutes    Moist Heat Location --   C/T junction ( unbilled)     Manual Therapy   Manual therapy comments STM/MWM at areas noted in assessment ot promote diaphragragmatic excursion, less forward head posture,  HOME EXERCISE PROGRAM: See pt instruction section    ASSESSMENT:  CLINICAL IMPRESSION:  Pt required more manual Tx mobilize C/T junction area to minimize forward head posture. Pt's past brain surgery with scar at the R posterior neck and the breast implant removal scars have limited pt's cervical/ throacic mobility. Advanced pt to scapular/ cervical retraction strengthening but required downgrade from green resistance band to red. Pt showed imrpoved coordination  post training. Plan to progress to cervical endurance training and later kegel strengthening at next session.   Pt benefits from skilled PT.    OBJECTIVE IMPAIRMENTS decreased activity tolerance, decreased coordination, decreased endurance, decreased mobility, difficulty walking, decreased ROM, decreased strength, decreased safety awareness, hypomobility, increased muscle spasms, impaired flexibility, improper body mechanics, postural dysfunction, and scar restrictions   ACTIVITY LIMITATIONS  self-care,  home chores, work tasks    PARTICIPATION LIMITATIONS:  community, gym activities    PERSONAL FACTORS      Pt's Hx of having a feeding tube with residual scar adhesions over abdomen and Hx of performing sit-up/ crunches are also affecting patient's functional outcome.    REHAB POTENTIAL: Good   CLINICAL DECISION MAKING: Evolving/moderate complexity   EVALUATION COMPLEXITY: Moderate    PATIENT EDUCATION:    Education details: Showed pt anatomy images. Explained muscles attachments/ connection, physiology of deep core system/ spinal- thoracic-pelvis-lower kinetic chain as they relate to pt's presentation, Sx, and past Hx. Explained what and how these areas of deficits need to be restored to balance and function    See Therapeutic activity / neuromuscular re-education section  Answered pt's questions.   Person educated: Patient Education method: Explanation, Demonstration, Tactile cues, Verbal cues, and Handouts Education comprehension: verbalized understanding, returned demonstration, verbal cues required, tactile cues required, and needs further education     PLAN: PT FREQUENCY: 1x/week   PT DURATION: 10 weeks   PLANNED INTERVENTIONS: Therapeutic exercises, Therapeutic activity, Neuromuscular re-education, Balance training, Gait training, Patient/Family education, Self Care, Joint mobilization, Spinal mobilization, Moist heat, Taping, and Manual therapy.   PLAN FOR NEXT SESSION: See clinical impression for plan     GOALS: Goals reviewed with patient? Yes  SHORT TERM GOALS: Target date: 05/11/2022    Pt will  demo IND with HEP                    Baseline: Not IND            Goal status: INITIAL   LONG TERM GOALS: Target date: 06/21/2022    1.Pt will demo proper deep core coordination without chest breathing and optimal excursion of diaphragm/pelvic floor in order to promote spinal stability and pelvic floor function  Baseline: dyscoordination Goal status: INITIAL  2.  Pt will demo > 5 pt change on FOTO  to improve QOL and function  Urinary Problem baseline Pelvic Pain baseline Bowel baseline PFDI Urinary baseline  Goal status: INITIAL  3.  Pt will demo proper body mechanics in against gravity tasks and ADLs  work tasks, fitness  to minimize straining pelvic floor / back                  Baseline: not IND, improper form that places strain on pelvic floor                Goal status: INITIAL    4. Pt reports straining with BMs < 25% of the time in order to minimize worsening of pelvic issues  Baseline: straining with bowel movements 60-70% of  the time Goal status: INITIAL    5. Pt will utilize urge suppression technique, proper lengthening of pelvic floor / relaxation with coordinated breathing in order to report decreased urge when seeing a bathroom, decreased frequency when feeling anxious, and improved completely emptying of urine.  Baseline:  difficulty with emptying urine completely, increased frequency when she sees a bathroom Goal status: INITIAL      Jerl Mina, PT 05/17/2022, 2:14 PM

## 2022-05-17 NOTE — Patient Instructions (Addendum)
Lying on back, knees bent   band under ballmounds  while laying on back w/ knees bent  "W" exercise  10 reps x 2 sets   Band is placed under feet, knees bent, feet are hip width apart Hold band with thumbs point out, keep upper arm and elbow touching the bed the whole time  - inhale and then exhale pull bands by bending elbows hands move in a "w"  (feel shoulder blades squeezing)   ________________   Oblique/ scapula stabilization   Opposite arm   Place band in "U"    band under ballmounds  while laying on back w/ knees bent     20 reps  on each side  Holding band from opposite thigh,  Inhale,    exhale then pull band across body while keeping elbow , shoulders, back of the head pressed down     ___    Towel roll under neck,   Press back of neck, shoulders, elbows now, with forearm at 90 deg to shoulder Move chin up at same time   20 reps

## 2022-05-24 ENCOUNTER — Ambulatory Visit: Payer: Medicare PPO | Attending: Internal Medicine | Admitting: Physical Therapy

## 2022-05-24 DIAGNOSIS — R2689 Other abnormalities of gait and mobility: Secondary | ICD-10-CM | POA: Diagnosis present

## 2022-05-24 DIAGNOSIS — M533 Sacrococcygeal disorders, not elsewhere classified: Secondary | ICD-10-CM | POA: Diagnosis not present

## 2022-05-24 DIAGNOSIS — R293 Abnormal posture: Secondary | ICD-10-CM | POA: Insufficient documentation

## 2022-05-24 NOTE — Therapy (Addendum)
OUTPATIENT PHYSICAL THERAPY Treatment    Patient Name: Phyllis Andrews MRN: [id number] DOB:06/16/1951, 71 y.o., female Today's Date: 05/24/2022   PT End of Session - 05/24/22 1331     Visit Number 7    Number of Visits 10    Date for PT Re-Evaluation 06/21/22    PT Start Time 1330    PT Stop Time 1415    PT Time Calculation (min) 45 min    Activity Tolerance Patient tolerated treatment well    Behavior During Therapy [facility] for tasks assessed/performed             Past Medical History:  Diagnosis Date   GERD (gastroesophageal reflux disease)    Hypercholesteremia    Laryngeal spasm    Meningioma ([place])    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   Past Surgical History:  Procedure Laterality Date   BRAIN SURGERY  2018   crainiotomy for meningioma   BREAST IMPLANT REMOVAL Bilateral 03/08/2022   Procedure: REMOVAL BREAST IMPLANTS;  Surgeon: Wallace Going, DO;  Location: [place];  Service: Plastics;  Laterality: Bilateral;  2.0 hours per surgeon   CATARACT EXTRACTION     COLONOSCOPY WITH PROPOFOL N/A 06/06/2021   Procedure: COLONOSCOPY WITH PROPOFOL;  Surgeon: Lesly Rubenstein, MD;  Location: [facility] ENDOSCOPY;  Service: Endoscopy;  Laterality: N/A;   PLACEMENT OF BREAST IMPLANTS     TONSILECTOMY, ADENOIDECTOMY, BILATERAL MYRINGOTOMY AND TUBES     Patient Active Problem List   Diagnosis Date Noted   Ruptured silicone breast implant 10/21/2021   Mixed hyperlipidemia 12/12/2019   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 07/19/2017   Meningioma ([place]) 07/19/2017   S/P resection of meningioma 07/19/2017   Apneic episode 07/05/2017   Cerebral edema ([place]) 07/04/2017   Dysphagia, neurologic 07/04/2017   Essential hypertension 07/04/2017   GERD (gastroesophageal reflux disease) 06/25/2017   Hematuria 11/09/2015   Mixed incontinence 11/09/2015    PCP: Tressia Miners   REFERRING PROVIDER:   Nadara Mode   REFERRING DIAG: N81.89 (ICD-10-CM) - Other female genital prolapse Rationale for Evaluation and Treatment Rehabilitation  THERAPY DIAG:  Sacrococcygeal disorders, not elsewhere classified  Abnormal posture  Other abnormalities of gait and mobility  ONSET DATE: 10 years ago   SUBJECTIVE:                                                                                                                                                                                                        SUBJECTIVE TODAY 05/24/22  1)  urinary changes:  Pt reports 5/10 times when does not make it to the bathroom without leakage in the morning.                           2)  bowel movements: same Pt is doing her posture exercises    SUBJECTIVE STATEMENT on EVAL 04/12/22 : 1) frequent urination/ urge incontinence/ SUI:  Pt had this issue 10 years ago. Pt went to a private PT clinic and did exercises. It helped some. She quit doing the exercises. Frequency within 2 hours:  0-2 x.  Pt sometimes feels she has not completely emptied. Pt thinks when she is stressed or anxious, frequency is increased. Sometimes, she is triggered to go to pee when she sees a bathroom. Pt has leaked before making it to the bathroom in the past. Pt changes her urinary pads 2 x day. Nocturia: 1 x night.   Daily fluid intake: 64 fl oz of water, no tea, coffee, coffee. Pt does not like to do leg lift because it makes her feel like she has to pee.   Pt has leakage with sneezing   2) straining with bowel movements 60-70% of the time: Pt take Metamucil. Pt has type 4 stool consistency.    PERTINENT HISTORY:            2018 meningiomona was removed and it had affected her   vocal cords and swallow. Pt had a feeding tube tor 5-6 months.  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 Pt recently had breast implant removed    Denied LBP, fall onto tailbone. Have not had  any children and denied abdominal surgeries. Pt did do sit -up and crunch on her own and at the gym and personal trainer. Pt plans to return to the trainer next week and will work with her 1 x week. Pt works out in [employer] 1 x on her own and attends yoga classes. Pt would like to attend a Pilates.    PAIN:  Are you having pain? No    PRECAUTIONS: None  WEIGHT BEARING RESTRICTIONS No  FALLS:  Has patient fallen in last 6 months? No  LIVING ENVIRONMENT: Lives with: lives with their spouse Lives in: House/apartment Stairs: Yes, one flight of stairs inside, back door steps 7-8 with rail    OCCUPATION: retired , Data processing manager work, hobbies, read, exercise some, care new puppy   PLOF: Independent  PATIENT GOALS   Not feel like she has to pee a lot    OBJECTIVE:     Pelvic Floor Special Questions - 05/24/22 1454     Pelvic Floor Internal Exam pt consented verbally without contraindications    Exam Type Vaginal    Palpation wincing tenderness at 11-2 o'clock, 4-8 o'clock at superficial- 2nd layers , tightness, silghtly lowered urethra/ bladder, limited lengthening of mm             [facility] PT Assessment - 05/24/22 1456       Coordination   Coordination and Movement Description excessive use of ab , downward movement of pelvic floor             [facility] Adult PT Treatment/Exercise - 05/24/22 1500       Therapeutic Activites    Other Therapeutic Activities explained pelvic floor coordination, kegel strengthening, deep core coordination with less ab overuse , modified deep core with pillow under hips  Neuro Re-ed    Neuro Re-ed Details  cued for deep core coordination without excessive straining of ab      Modalities   Modalities Moist Heat      Moist Heat Therapy   Number Minutes Moist Heat 3 Minutes    Moist Heat Location --   perineum, butterfly pose supported ( unbilled)     Manual Therapy   Manual therapy comments STM/MWM at areas noted in assessment ot  promote diaphragragmatic excursion, less forward head posture,                     HOME EXERCISE PROGRAM: See pt instruction section    ASSESSMENT:  CLINICAL IMPRESSION:  Pt required more manual Tx to minimize pelvic floor overactivity. Pt showed significant tightness and tenderness of pelvic floor mm with slightly lowered urethra/ bladder and dyscoordination of pelvic floor with abdominal overuse.     Plan withhold kegel strengthening  due to overactivity of pelvic floor.   Plan to continue addressing pelvic floor now that pt is maintaining more upright posture and spinal and pelvic alignment has improved.   Pt benefits from skilled PT.   OBJECTIVE IMPAIRMENTS decreased activity tolerance, decreased coordination, decreased endurance, decreased mobility, difficulty walking, decreased ROM, decreased strength, decreased safety awareness, hypomobility, increased muscle spasms, impaired flexibility, improper body mechanics, postural dysfunction, and scar restrictions   ACTIVITY LIMITATIONS  self-care,  home chores, work tasks    PARTICIPATION LIMITATIONS:  community, gym activities    PERSONAL FACTORS      Pt's Hx of having a feeding tube with residual scar adhesions over abdomen and Hx of performing sit-up/ crunches are also affecting patient's functional outcome.    REHAB POTENTIAL: Good   CLINICAL DECISION MAKING: Evolving/moderate complexity   EVALUATION COMPLEXITY: Moderate    PATIENT EDUCATION:    Education details: Showed pt anatomy images. Explained muscles attachments/ connection, physiology of deep core system/ spinal- thoracic-pelvis-lower kinetic chain as they relate to pt's presentation, Sx, and past Hx. Explained what and how these areas of deficits need to be restored to balance and function    See Therapeutic activity / neuromuscular re-education section  Answered pt's questions.   Person educated: Patient Education method: Explanation,  Demonstration, Tactile cues, Verbal cues, and Handouts Education comprehension: verbalized understanding, returned demonstration, verbal cues required, tactile cues required, and needs further education     PLAN: PT FREQUENCY: 1x/week   PT DURATION: 10 weeks   PLANNED INTERVENTIONS: Therapeutic exercises, Therapeutic activity, Neuromuscular re-education, Balance training, Gait training, Patient/Family education, Self Care, Joint mobilization, Spinal mobilization, Moist heat, Taping, and Manual therapy.   PLAN FOR NEXT SESSION: See clinical impression for plan     GOALS: Goals reviewed with patient? Yes  SHORT TERM GOALS: Target date: 05/11/2022    Pt will demo IND with HEP                    Baseline: Not IND            Goal status: INITIAL   LONG TERM GOALS: Target date: 06/21/2022    1.Pt will demo proper deep core coordination without chest breathing and optimal excursion of diaphragm/pelvic floor in order to promote spinal stability and pelvic floor function  Baseline: dyscoordination Goal status: INITIAL  2.  Pt will demo > 5 pt change on FOTO  to improve QOL and function  Urinary Problem baseline Pelvic Pain baseline Bowel baseline PFDI Urinary baseline  Goal status: INITIAL  3.  Pt will demo proper body mechanics in against gravity tasks and ADLs  work tasks, fitness  to minimize straining pelvic floor / back                  Baseline: not IND, improper form that places strain on pelvic floor                Goal status: INITIAL    4. Pt reports straining with BMs < 25% of the time in order to minimize worsening of pelvic issues  Baseline: straining with bowel movements 60-70% of the time Goal status: INITIAL    5. Pt will utilize urge suppression technique, proper lengthening of pelvic floor / relaxation with coordinated breathing in order to report decreased urge when seeing a bathroom, decreased frequency when feeling anxious, and improved completely  emptying of urine.  Baseline:  difficulty with emptying urine completely, increased frequency when she sees a bathroom Goal status: INITIAL      Jerl Mina, PT 05/24/2022, 1:32 PM

## 2022-05-31 ENCOUNTER — Encounter: Payer: Medicare PPO | Admitting: Physical Therapy

## 2022-06-07 ENCOUNTER — Ambulatory Visit: Payer: Medicare PPO | Admitting: Physical Therapy

## 2022-06-07 DIAGNOSIS — R293 Abnormal posture: Secondary | ICD-10-CM

## 2022-06-07 DIAGNOSIS — M533 Sacrococcygeal disorders, not elsewhere classified: Secondary | ICD-10-CM | POA: Diagnosis not present

## 2022-06-07 DIAGNOSIS — R2689 Other abnormalities of gait and mobility: Secondary | ICD-10-CM

## 2022-06-07 NOTE — Patient Instructions (Signed)
  Deep core level 1-2 with more pelvic floor awareness of lengthening   ____ Happy baby with hamstring stretches   10 reps   __    Ardine Eng pose rocking   Toes tucked, shoulders down and back, on forearms , hands shoulder width apart, fingers straight, elbow back , squeeze imaginary pencils in armpit, shoulder down and away from ears  10 reps  __  Mermaid rocking 10 reps   Then with arm overhead like a rainbow , elbow back and palms up To stretch side body, shoulders back and down,

## 2022-06-07 NOTE — Therapy (Signed)
OUTPATIENT PHYSICAL THERAPY Treatment    Patient Name: Phyllis Andrews MRN: [id number] DOB:05/13/1951, 71 y.o., female Today's Date: 06/07/2022   PT End of Session - 06/07/22 1336     Visit Number 8    Number of Visits 10    Date for PT Re-Evaluation 06/21/22    PT Start Time 1332    PT Stop Time 1415    PT Time Calculation (min) 43 min    Activity Tolerance Patient tolerated treatment well    Behavior During Therapy [facility] for tasks assessed/performed             Past Medical History:  Diagnosis Date   GERD (gastroesophageal reflux disease)    Hypercholesteremia    Laryngeal spasm    Meningioma ([place])    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   Past Surgical History:  Procedure Laterality Date   BRAIN SURGERY  2018   crainiotomy for meningioma   BREAST IMPLANT REMOVAL Bilateral 03/08/2022   Procedure: REMOVAL BREAST IMPLANTS;  Surgeon: Wallace Going, DO;  Location: [place];  Service: Plastics;  Laterality: Bilateral;  2.0 hours per surgeon   CATARACT EXTRACTION     COLONOSCOPY WITH PROPOFOL N/A 06/06/2021   Procedure: COLONOSCOPY WITH PROPOFOL;  Surgeon: Lesly Rubenstein, MD;  Location: [facility];  Service: Endoscopy;  Laterality: N/A;   PLACEMENT OF BREAST IMPLANTS     TONSILECTOMY, ADENOIDECTOMY, BILATERAL MYRINGOTOMY AND TUBES     Patient Active Problem List   Diagnosis Date Noted   Ruptured silicone breast implant 10/21/2021   Mixed hyperlipidemia 12/12/2019   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 07/19/2017   Meningioma ([place]) 07/19/2017   S/P resection of meningioma 07/19/2017   Apneic episode 07/05/2017   Cerebral edema ([place]) 07/04/2017   Dysphagia, neurologic 07/04/2017   Essential hypertension 07/04/2017   GERD (gastroesophageal reflux disease) 06/25/2017   Hematuria 11/09/2015   Mixed incontinence 11/09/2015    PCP: Tressia Miners   REFERRING PROVIDER:   Nadara Mode   REFERRING DIAG: N81.89 (ICD-10-CM) - Other female genital prolapse Rationale for Evaluation and Treatment Rehabilitation  THERAPY DIAG:  Sacrococcygeal disorders, not elsewhere classified  Abnormal posture  Other abnormalities of gait and mobility  ONSET DATE: 10 years ago   SUBJECTIVE:                                                                                                                                                                                                        SUBJECTIVE TODAY 05/24/22  1)  urinary changes:  Pt notices she is having "less close calls" than it was. Pt had one episode of leakage across 2 weeks.                            2)  bowel movements:  They are good     SUBJECTIVE STATEMENT on EVAL 04/12/22 : 1) frequent urination/ urge incontinence/ SUI:  Pt had this issue 10 years ago. Pt went to a private PT clinic and did exercises. It helped some. She quit doing the exercises. Frequency within 2 hours:  0-2 x.  Pt sometimes feels she has not completely emptied. Pt thinks when she is stressed or anxious, frequency is increased. Sometimes, she is triggered to go to pee when she sees a bathroom. Pt has leaked before making it to the bathroom in the past. Pt changes her urinary pads 2 x day. Nocturia: 1 x night.   Daily fluid intake: 64 fl oz of water, no tea, coffee, coffee. Pt does not like to do leg lift because it makes her feel like she has to pee.   Pt has leakage with sneezing   2) straining with bowel movements 60-70% of the time: Pt take Metamucil. Pt has type 4 stool consistency.    PERTINENT HISTORY:            2018 meningiomona was removed and it had affected her   vocal cords and swallow. Pt had a feeding tube tor 5-6 months.  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 Pt recently had breast implant removed    Denied LBP, fall onto tailbone. Have not had any children  and denied abdominal surgeries. Pt did do sit -up and crunch on her own and at the gym and personal trainer. Pt plans to return to the trainer next week and will work with her 1 x week. Pt works out in [employer] 1 x on her own and attends yoga classes. Pt would like to attend a Pilates.    PAIN:  Are you having pain? No    PRECAUTIONS: None  WEIGHT BEARING RESTRICTIONS No  FALLS:  Has patient fallen in last 6 months? No  LIVING ENVIRONMENT: Lives with: lives with their spouse Lives in: House/apartment Stairs: Yes, one flight of stairs inside, back door steps 7-8 with rail    OCCUPATION: retired , Data processing manager work, hobbies, read, exercise some, care new puppy   PLOF: Independent  PATIENT GOALS   Not feel like she has to pee a lot    OBJECTIVE:     Pelvic Floor Special Questions - 06/07/22 1406     External Palpation tightenss along transverse perineal tightness              [facility] PT Assessment - 06/07/22 1405       Observation/Other Assessments   Observations childs pose with limite toe extension      Flexibility   Hamstrings limited              [facility] Adult PT Treatment/Exercise - 06/07/22 1408       Neuro Re-ed    Neuro Re-ed Details  cued for more pelvic floor propioception with deep core level 1-2,  clam shells with LKC,  yoga poses for pelvic floor lengthening/ hamstring  , scapular retraction      Exercises   Other Exercises  seee pt instruction      Manual Therapy  Manual therapy comments STM/MWM at areas noted in assessment ot promote pelvic floor lengthening                HOME EXERCISE PROGRAM: See pt instruction section    ASSESSMENT:  CLINICAL IMPRESSION: Pt's incontinence is gradually improving as pt reported she had one episode of leakage over the past 2 weeks.  Pt showed improved upright posture with less rounded shoulders as pt is compliant with HEP.  Pelvic floor external Tx was performed and tightness was still  present. Modified yoga poses to lengthen pelvic floor as pt is eager to get back to yoga.  Also provided customized yoga posture to help further promote upright posture and addressing her past deficits to minimize relapse of poor posture.  At next session, plan to add strap stretches to address tight hamstrings.  Pt benefits from skilled PT.   OBJECTIVE IMPAIRMENTS decreased activity tolerance, decreased coordination, decreased endurance, decreased mobility, difficulty walking, decreased ROM, decreased strength, decreased safety awareness, hypomobility, increased muscle spasms, impaired flexibility, improper body mechanics, postural dysfunction, and scar restrictions   ACTIVITY LIMITATIONS  self-care,  home chores, work tasks    PARTICIPATION LIMITATIONS:  community, gym activities    PERSONAL FACTORS      Pt's Hx of having a feeding tube with residual scar adhesions over abdomen and Hx of performing sit-up/ crunches are also affecting patient's functional outcome.    REHAB POTENTIAL: Good   CLINICAL DECISION MAKING: Evolving/moderate complexity   EVALUATION COMPLEXITY: Moderate    PATIENT EDUCATION:    Education details: Showed pt anatomy images. Explained muscles attachments/ connection, physiology of deep core system/ spinal- thoracic-pelvis-lower kinetic chain as they relate to pt's presentation, Sx, and past Hx. Explained what and how these areas of deficits need to be restored to balance and function    See Therapeutic activity / neuromuscular re-education section  Answered pt's questions.   Person educated: Patient Education method: Explanation, Demonstration, Tactile cues, Verbal cues, and Handouts Education comprehension: verbalized understanding, returned demonstration, verbal cues required, tactile cues required, and needs further education     PLAN: PT FREQUENCY: 1x/week   PT DURATION: 10 weeks   PLANNED INTERVENTIONS: Therapeutic exercises, Therapeutic activity,  Neuromuscular re-education, Balance training, Gait training, Patient/Family education, Self Care, Joint mobilization, Spinal mobilization, Moist heat, Taping, and Manual therapy.   PLAN FOR NEXT SESSION: See clinical impression for plan     GOALS: Goals reviewed with patient? Yes  SHORT TERM GOALS: Target date: 05/11/2022    Pt will demo IND with HEP                    Baseline: Not IND            Goal status: INITIAL   LONG TERM GOALS: Target date: 06/21/2022    1.Pt will demo proper deep core coordination without chest breathing and optimal excursion of diaphragm/pelvic floor in order to promote spinal stability and pelvic floor function  Baseline: dyscoordination Goal status: INITIAL  2.  Pt will demo > 5 pt change on FOTO  to improve QOL and function  Urinary Problem baseline Pelvic Pain baseline Bowel baseline PFDI Urinary baseline  Goal status: INITIAL  3.  Pt will demo proper body mechanics in against gravity tasks and ADLs  work tasks, fitness  to minimize straining pelvic floor / back                  Baseline: not IND, improper form that places  strain on pelvic floor                Goal status: INITIAL    4. Pt reports straining with BMs < 25% of the time in order to minimize worsening of pelvic issues  Baseline: straining with bowel movements 60-70% of the time Goal status: INITIAL    5. Pt will utilize urge suppression technique, proper lengthening of pelvic floor / relaxation with coordinated breathing in order to report decreased urge when seeing a bathroom, decreased frequency when feeling anxious, and improved completely emptying of urine.  Baseline:  difficulty with emptying urine completely, increased frequency when she sees a bathroom Goal status: INITIAL      Jerl Mina, PT 06/07/2022, 2:18 PM

## 2022-06-20 ENCOUNTER — Ambulatory Visit: Payer: Medicare PPO | Admitting: Plastic Surgery

## 2022-06-20 ENCOUNTER — Encounter: Payer: Self-pay | Admitting: Plastic Surgery

## 2022-06-20 VITALS — BP 153/86 | HR 77

## 2022-06-20 DIAGNOSIS — T8543XD Leakage of breast prosthesis and implant, subsequent encounter: Secondary | ICD-10-CM

## 2022-06-20 NOTE — Progress Notes (Signed)
The patient is a 71 year old female here for follow-up after undergoing implant removals.  Overall she is doing extremely well and she is pleased with her results.  There is no sign of a hematoma.  She still wearing the compression.  She can go into the sports bra whenever she is ready.  Follow-up as needed.  Pictures were obtained of the patient and placed in the chart with the patient's or guardian's permission.

## 2022-07-26 ENCOUNTER — Ambulatory Visit: Payer: Medicare PPO | Admitting: Physical Therapy

## 2022-08-09 ENCOUNTER — Ambulatory Visit: Payer: Medicare PPO | Admitting: Physical Therapy

## 2022-08-23 ENCOUNTER — Encounter: Payer: Medicare PPO | Admitting: Physical Therapy

## 2022-08-30 ENCOUNTER — Encounter: Payer: Medicare PPO | Admitting: Physical Therapy

## 2022-09-13 ENCOUNTER — Encounter: Payer: Medicare PPO | Admitting: Physical Therapy

## 2022-11-12 IMAGING — CT CT CHEST W/O CM
2 of 4 series · 15 of 36 positions shown, 18 images · non-contrast
Comparison: Chest CT 10/06/2020; [DATE] [DATE], [DATE]

CLINICAL DATA: Follow-up pulmonary nodule.



[Series 2: chest 2.00 · axial · 0.57mm/px · z∈[-1191,-927]mm · 12 of 158 slices shown, 15 images]
[im 13/158  mediastinal]
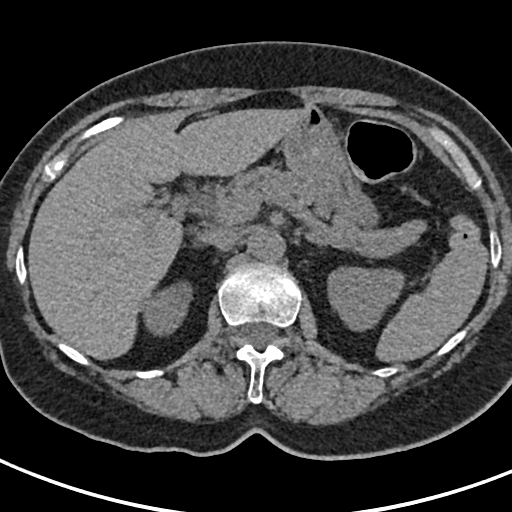
[im 13/158  lung]
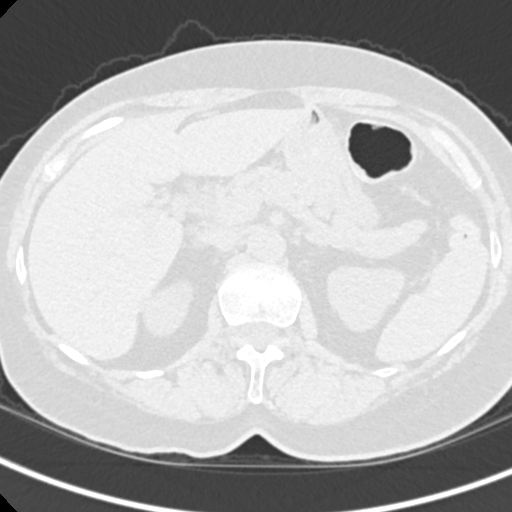
[im 25/158  lung]
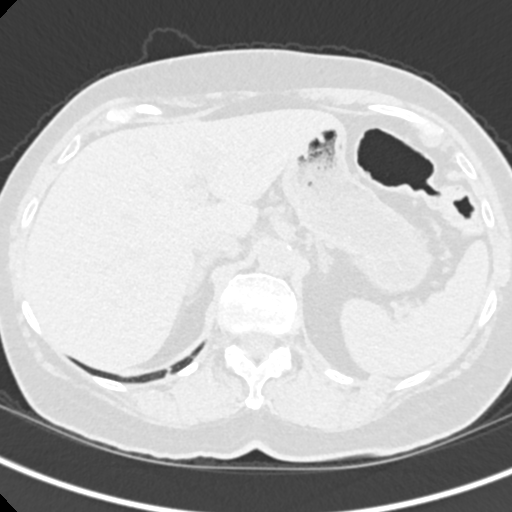
[im 37/158  lung]
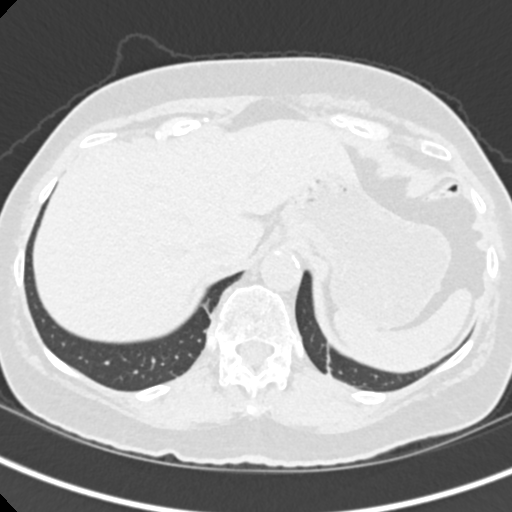
[im 49/158  lung]
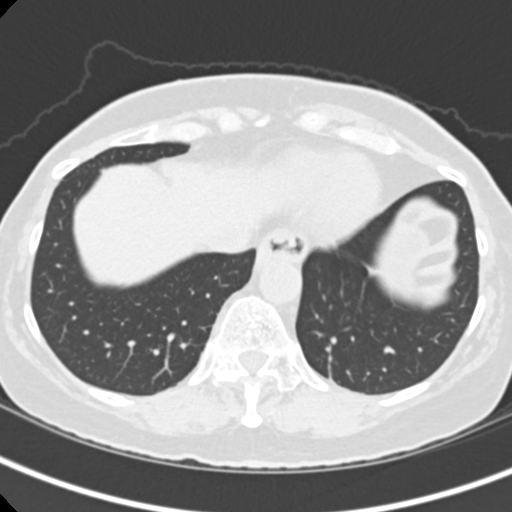
[im 61/158  mediastinal]
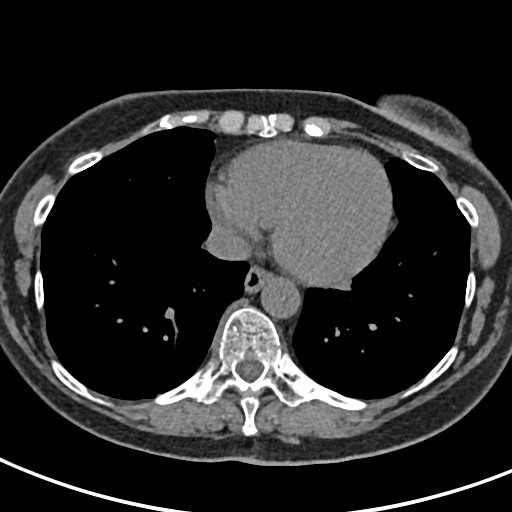
[im 61/158  lung]
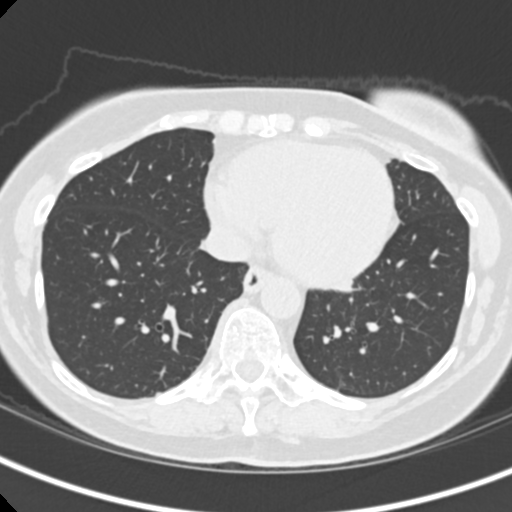
[im 73/158  lung]
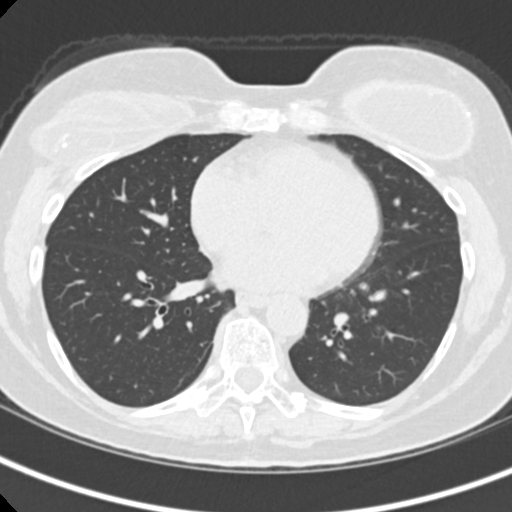
[im 85/158  lung]
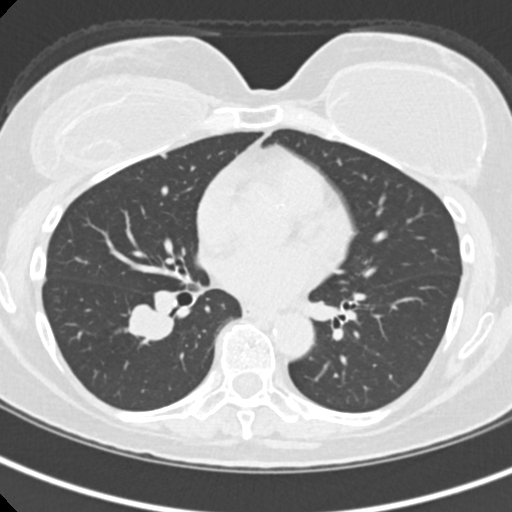
[im 97/158  lung]
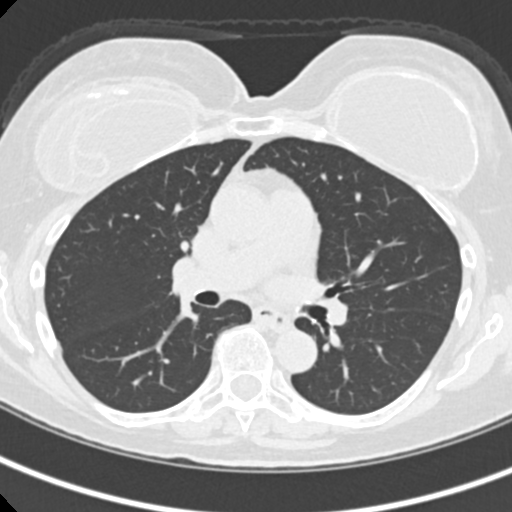
[im 109/158  mediastinal]
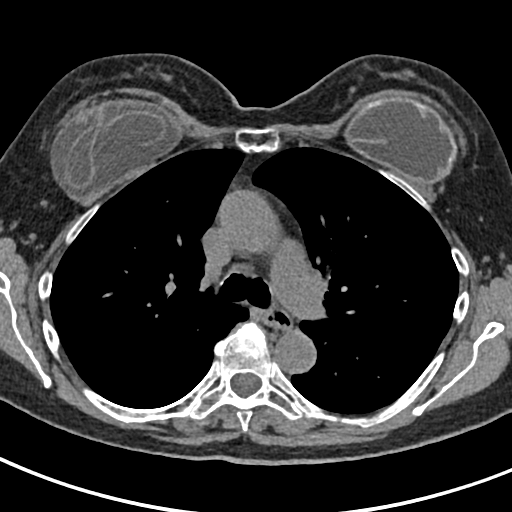
[im 109/158  lung]
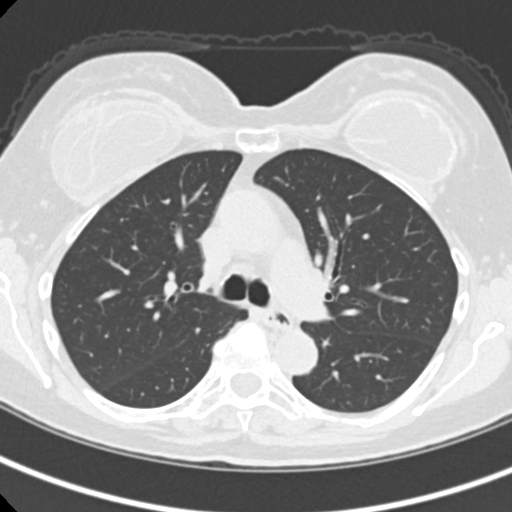
[im 121/158  lung]
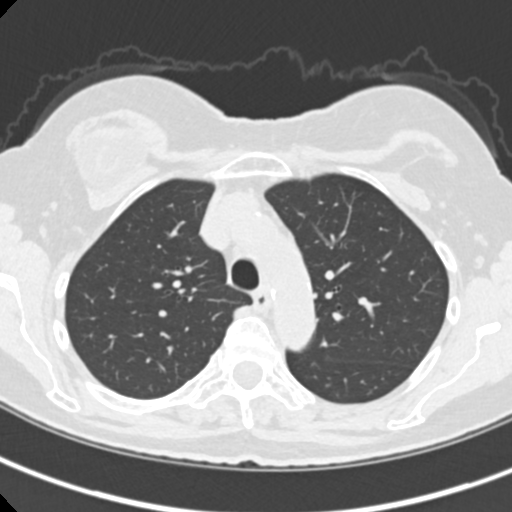
[im 133/158  lung]
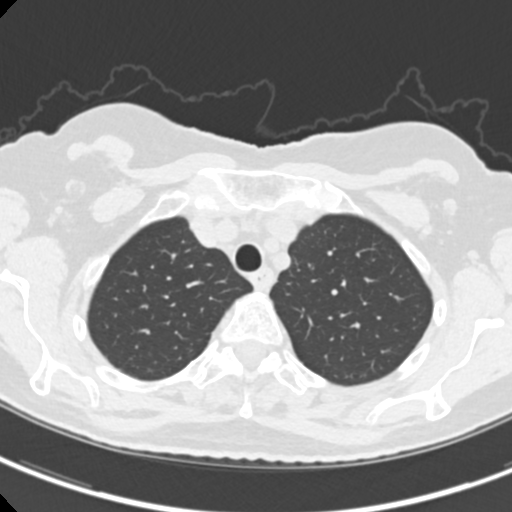
[im 145/158  lung]
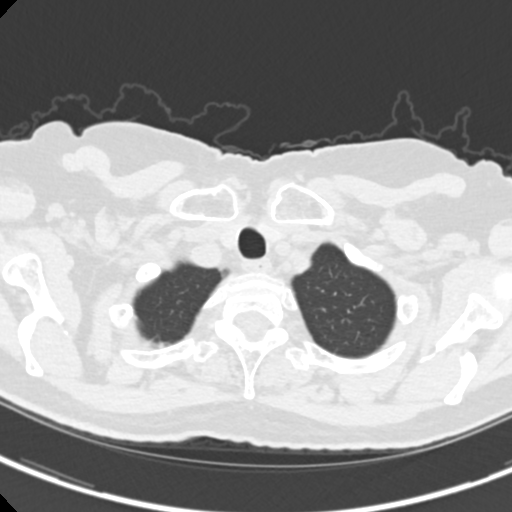

[Series 5: coronals chest 2.00 cor · coronal · 0.57mm/px · 3 of 146 slices shown]
[im 30/146  lung]
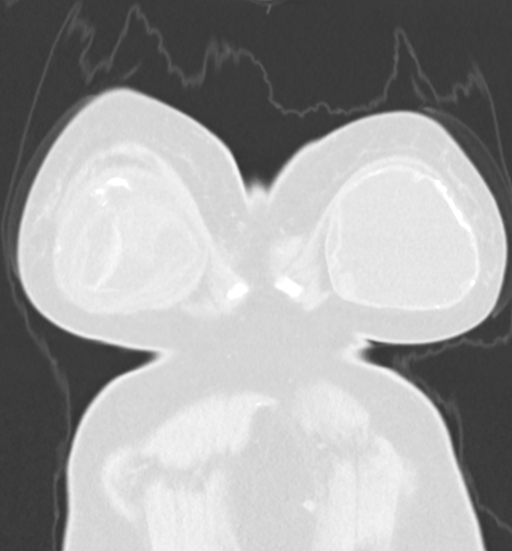
[im 59/146  lung]
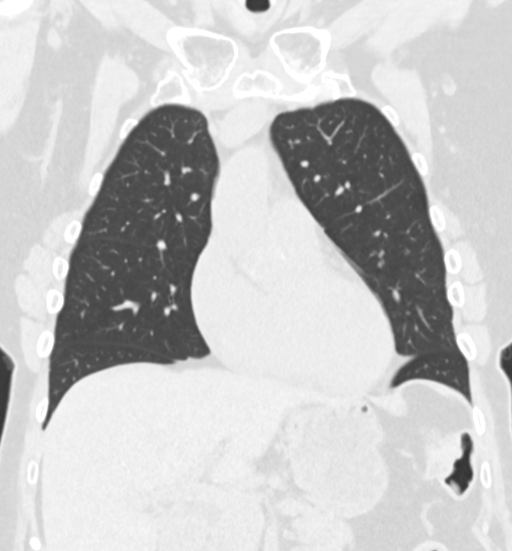
[im 88/146  lung]
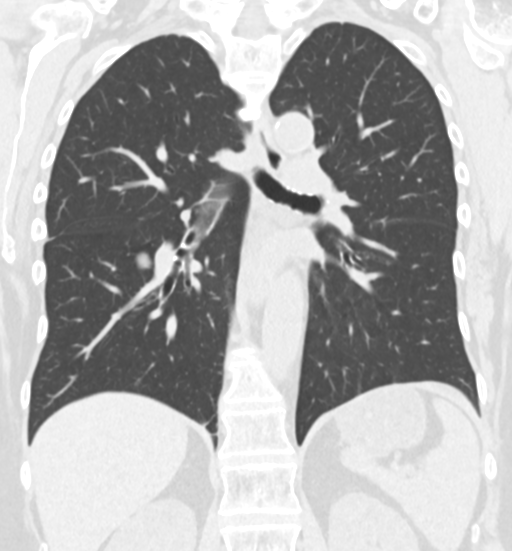

[15 of 36 positions shown; findings below may reference images not displayed]

FINDINGS: Cardiovascular: Normal heart size. Thoracic aortic vascular
calcifications. Trace fluid superior pericardial recess.

Mediastinum/Nodes: Small hiatal hernia. No enlarged axillary,
mediastinal or hilar lymphadenopathy.

Lungs/Pleura: Central airways are patent. Grossly unchanged right
infrahilar nodule measuring 2.4 x 1.6 cm (image 75; series 3) with
faint internal calcification. Minimal left basilar atelectasis.
Interval resolution of previously described tree-in-bud nodularity
within the left upper lobe compatible with resolved
infectious/inflammatory process. There is a new 5 mm right lower
lobe nodule (image 84; series 3). There is a small amount of
adjacent ground-glass opacity. No pleural effusion or pneumothorax.

Upper Abdomen: Unremarkable.

Musculoskeletal: Thoracic spine degenerative changes. Findings
compatible with right intracapsular implant rupture and possible
left implant rupture.
IMPRESSION: 1. Grossly unchanged 2.4 cm nodule within the infrahilar right lower
lobe.
2. There is a new 5 mm right lower lobe nodule.
3. Given the size and appearance of the larger right lower lobe
nodule, as well as stability over time, a benign process is favored.
Given the new interval adjacent small pulmonary nodule, follow-up
chest CT in 6 months is recommended to reassess both of these
lesions.
4. Right intracapsular implant rupture. Probable left intracapsular
rupture. Consider bilateral breast MRI as clinically indicated.
5. Aortic Atherosclerosis ([id number]).

## 2022-12-25 ENCOUNTER — Other Ambulatory Visit: Payer: Self-pay | Admitting: Internal Medicine

## 2022-12-25 DIAGNOSIS — Z1231 Encounter for screening mammogram for malignant neoplasm of breast: Secondary | ICD-10-CM

## 2023-02-05 ENCOUNTER — Ambulatory Visit
Admission: RE | Admit: 2023-02-05 | Discharge: 2023-02-05 | Disposition: A | Discharge status: Home / Self Care | Payer: Medicare PPO | Source: Ambulatory Visit | Attending: Internal Medicine | Admitting: Internal Medicine

## 2023-02-05 DIAGNOSIS — Z1231 Encounter for screening mammogram for malignant neoplasm of breast: Secondary | ICD-10-CM | POA: Insufficient documentation

## 2023-03-27 ENCOUNTER — Other Ambulatory Visit: Payer: Self-pay

## 2023-03-27 DIAGNOSIS — R911 Solitary pulmonary nodule: Secondary | ICD-10-CM

## 2023-04-05 ENCOUNTER — Telehealth: Payer: Self-pay

## 2023-04-05 NOTE — Telephone Encounter (Signed)
Message left for the patient to call to let us know when she would like to have her CT scan done and follow up with Dr Everlene Farrier

## 2023-04-18 NOTE — Telephone Encounter (Signed)
Patient called back and said Monday or Wednesday in the afternoon, not the 2nd or 3rd week in October, she will be out of town

## 2023-04-24 ENCOUNTER — Other Ambulatory Visit: Payer: Self-pay | Admitting: Sports Medicine

## 2023-04-24 DIAGNOSIS — G8929 Other chronic pain: Secondary | ICD-10-CM

## 2023-04-24 DIAGNOSIS — M1711 Unilateral primary osteoarthritis, right knee: Secondary | ICD-10-CM

## 2023-04-27 ENCOUNTER — Encounter: Payer: Self-pay | Admitting: Sports Medicine

## 2023-04-30 ENCOUNTER — Ambulatory Visit
Admission: RE | Admit: 2023-04-30 | Discharge: 2023-04-30 | Disposition: A | Discharge status: Home / Self Care | Payer: Medicare PPO | Source: Ambulatory Visit | Attending: Sports Medicine | Admitting: Sports Medicine

## 2023-04-30 DIAGNOSIS — G8929 Other chronic pain: Secondary | ICD-10-CM

## 2023-04-30 DIAGNOSIS — M1711 Unilateral primary osteoarthritis, right knee: Secondary | ICD-10-CM

## 2023-05-08 ENCOUNTER — Other Ambulatory Visit: Payer: Medicare PPO

## 2023-05-21 ENCOUNTER — Ambulatory Visit
Admission: RE | Admit: 2023-05-21 | Discharge: 2023-05-21 | Disposition: A | Discharge status: Home / Self Care | Payer: Medicare PPO | Source: Ambulatory Visit | Attending: Surgery | Admitting: Surgery

## 2023-05-21 DIAGNOSIS — R911 Solitary pulmonary nodule: Secondary | ICD-10-CM | POA: Insufficient documentation

## 2023-06-04 ENCOUNTER — Ambulatory Visit: Payer: Medicare PPO | Admitting: Surgery

## 2023-06-04 ENCOUNTER — Encounter: Payer: Self-pay | Admitting: Surgery

## 2023-06-04 VITALS — BP 161/89 | HR 81 | Temp 98.5°F | Ht 67.0 in

## 2023-06-04 DIAGNOSIS — R911 Solitary pulmonary nodule: Secondary | ICD-10-CM

## 2023-06-04 NOTE — Patient Instructions (Signed)
   Follow-up with our office as needed.  Please call and ask to speak with a nurse if you develop questions or concerns.  

## 2023-06-07 NOTE — Progress Notes (Addendum)
Outpatient Surgical Follow Up 06/04/23  Phyllis Andrews is an 72 y.o. female.   Chief Complaint  Patient presents with   Follow-up    Lung nodule CT done prior    HPI: Phyllis Andrews is a 72 year old female patient of Dr. Thelma Barge that was seen for right pulmonary nodule on the right lower lobe. She elected to have surveillance and had a recent CT of the chest that have personally reviewed showing a stable 2.4 cm nodule right lower lobe c/y hamartoma. No new nodules She denies any fevers or chills or hemoptysis.  She does endorse some cough. SHe is overall doing well and has no issues. 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    Past Surgical History:  Procedure Laterality Date   BRAIN SURGERY  2018   crainiotomy for meningioma   BREAST IMPLANT REMOVAL Bilateral 03/08/2022   Procedure: REMOVAL BREAST IMPLANTS;  Surgeon: Peggye Form, DO;  Location: [facility];  Service: Plastics;  Laterality: Bilateral;  2.0 hours per surgeon   breast implants removed Bilateral    CATARACT EXTRACTION     COLONOSCOPY WITH PROPOFOL N/A 06/06/2021   Procedure: COLONOSCOPY WITH PROPOFOL;  Surgeon: Regis Bill, MD;  Location: [facility];  Service: Endoscopy;  Laterality: N/A;   PLACEMENT OF BREAST IMPLANTS     TONSILECTOMY, ADENOIDECTOMY, BILATERAL MYRINGOTOMY AND TUBES      Family History  Problem Relation Age of Onset   Dementia Mother    Dementia Father    Prostate cancer Brother     Social History:  reports that she has never smoked. She has never used smokeless tobacco. She reports current alcohol use. She reports that she does not use drugs.  Allergies:  Allergies  Allergen Reactions   Methylprednisolone Acetate Other (See Comments)    Severe facial swelling    Medications  reviewed.    ROS Full ROS performed and is otherwise negative other than what is stated in HPI   BP (!) 161/89 (BP Location: Right Arm, Patient Position: Sitting, Cuff Size: Small)   Pulse 81   Temp 98.5 F (36.9 C) (Oral)   Ht 5\' 7"  (1.702 m)   SpO2 96%   BMI 25.90 kg/m   Physical Exam Physical Exam Vitals and nursing note reviewed. Exam conducted with a chaperone present.  Constitutional:      General: She is not in acute distress.    Appearance: Normal appearance.  Cardiovascular:     Rate and Rhythm: Normal rate and regular rhythm.  Pulmonary:     Effort: Pulmonary effort is normal. No respiratory distress.     Breath sounds: Normal breath sounds. No stridor. No rhonchi.  Abdominal:     General: Abdomen is flat. There is no distension.     Palpations: Abdomen is soft. There is no mass.     Tenderness: There is no abdominal tenderness.     Hernia: No hernia is present.  Musculoskeletal:        General: Normal range of motion.     Cervical back: Normal range of motion and neck supple. No rigidity or tenderness.  Lymphadenopathy:     Cervical: No cervical adenopathy.  Skin:    General: Skin is warm and dry.     Capillary Refill: Capillary  refill takes less than 2 seconds.  Neurological:     General: No focal deficit present.     Mental Status: She is alert and oriented to person, place, and time.  Psychiatric:        Mood and Affect: Mood normal.        Behavior: Behavior normal.        Thought Content: Thought content normal.        Judgment: Judgment normal.   Assessment/Plan: 72 year old female with a stable right pulmonary nodules.  No need for surgical intervention    HTN counseling we measured BP twice and found it elevated. Advice to seek PCP advice Please note I spent 30 minutes in this encounter including personally reviewing imaging studies, coordinating her care, placing orders, counseling the patient and performing appropriate documentation HTN  advice pt to contact PCP, we  measured this twice and was elevated   Sterling Big, MD [facility] Surgeon

## 2023-07-11 ENCOUNTER — Other Ambulatory Visit: Payer: Self-pay | Admitting: Internal Medicine

## 2023-07-11 DIAGNOSIS — I7090 Unspecified atherosclerosis: Secondary | ICD-10-CM

## 2023-07-11 DIAGNOSIS — Z0000 Encounter for general adult medical examination without abnormal findings: Secondary | ICD-10-CM

## 2023-07-19 ENCOUNTER — Ambulatory Visit
Admission: RE | Admit: 2023-07-19 | Discharge: 2023-07-19 | Disposition: A | Discharge status: Home / Self Care | Payer: Self-pay | Source: Ambulatory Visit | Attending: Internal Medicine | Admitting: Internal Medicine

## 2023-07-19 DIAGNOSIS — Z0000 Encounter for general adult medical examination without abnormal findings: Secondary | ICD-10-CM | POA: Insufficient documentation

## 2023-07-19 DIAGNOSIS — I7090 Unspecified atherosclerosis: Secondary | ICD-10-CM | POA: Insufficient documentation

## 2024-01-08 ENCOUNTER — Other Ambulatory Visit: Payer: Self-pay | Admitting: Internal Medicine

## 2024-01-08 DIAGNOSIS — Z1231 Encounter for screening mammogram for malignant neoplasm of breast: Secondary | ICD-10-CM

## 2024-02-12 ENCOUNTER — Ambulatory Visit
Admission: RE | Admit: 2024-02-12 | Discharge: 2024-02-12 | Disposition: A | Discharge status: Home / Self Care | Source: Ambulatory Visit | Attending: Internal Medicine | Admitting: Internal Medicine

## 2024-02-12 DIAGNOSIS — Z1231 Encounter for screening mammogram for malignant neoplasm of breast: Secondary | ICD-10-CM | POA: Insufficient documentation
